# Patient Record
Sex: Female | Born: 1945 | Race: White | Hispanic: No | Marital: Married | Smoking: Never smoker
Health system: Southern US, Community
[De-identification: ages and names within clinical notes are randomized; demographics above are authoritative.]

## PROBLEM LIST (undated history)

## (undated) DIAGNOSIS — K219 Gastro-esophageal reflux disease without esophagitis: Secondary | ICD-10-CM

## (undated) DIAGNOSIS — F419 Anxiety disorder, unspecified: Secondary | ICD-10-CM

## (undated) DIAGNOSIS — Z5189 Encounter for other specified aftercare: Secondary | ICD-10-CM

## (undated) DIAGNOSIS — D126 Benign neoplasm of colon, unspecified: Secondary | ICD-10-CM

## (undated) HISTORY — PX: REPLACEMENT TOTAL KNEE: SUR1224

## (undated) HISTORY — PX: OTHER SURGICAL HISTORY: SHX169

## (undated) HISTORY — DX: Encounter for other specified aftercare: Z51.89

## (undated) HISTORY — DX: Benign neoplasm of colon, unspecified: D12.6

## (undated) HISTORY — PX: CATARACT EXTRACTION, BILATERAL: SHX1313

## (undated) HISTORY — DX: Anxiety disorder, unspecified: F41.9

## (undated) HISTORY — DX: Gastro-esophageal reflux disease without esophagitis: K21.9

---
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
## 1997-11-30 ENCOUNTER — Ambulatory Visit (HOSPITAL_COMMUNITY): Admission: RE | Admit: 1997-11-30 | Discharge: 1997-11-30 | Payer: Self-pay | Admitting: Pulmonary Disease

## 1997-11-30 ENCOUNTER — Encounter: Payer: Self-pay | Admitting: Pulmonary Disease

## 1998-01-06 ENCOUNTER — Other Ambulatory Visit: Admission: RE | Admit: 1998-01-06 | Discharge: 1998-01-06 | Payer: Self-pay | Admitting: Gynecology

## 1999-02-08 ENCOUNTER — Other Ambulatory Visit: Admission: RE | Admit: 1999-02-08 | Discharge: 1999-02-08 | Payer: Self-pay | Admitting: Gynecology

## 1999-03-24 HISTORY — PX: ANAL FISSURE REPAIR: SHX2312

## 1999-04-18 ENCOUNTER — Ambulatory Visit (HOSPITAL_COMMUNITY): Admission: RE | Admit: 1999-04-18 | Discharge: 1999-04-18 | Payer: Self-pay | Admitting: General Surgery

## 1999-12-01 ENCOUNTER — Encounter: Payer: Self-pay | Admitting: Pulmonary Disease

## 1999-12-01 ENCOUNTER — Encounter: Admission: RE | Admit: 1999-12-01 | Discharge: 1999-12-01 | Payer: Self-pay | Admitting: Pulmonary Disease

## 2000-03-15 ENCOUNTER — Other Ambulatory Visit: Admission: RE | Admit: 2000-03-15 | Discharge: 2000-03-15 | Payer: Self-pay | Admitting: Gynecology

## 2003-01-05 ENCOUNTER — Other Ambulatory Visit: Admission: RE | Admit: 2003-01-05 | Discharge: 2003-01-05 | Payer: Self-pay | Admitting: Gynecology

## 2004-01-12 ENCOUNTER — Ambulatory Visit: Payer: Self-pay | Admitting: Pulmonary Disease

## 2004-01-20 ENCOUNTER — Ambulatory Visit: Payer: Self-pay | Admitting: Pulmonary Disease

## 2004-02-18 ENCOUNTER — Ambulatory Visit: Payer: Self-pay | Admitting: Pulmonary Disease

## 2004-02-18 ENCOUNTER — Other Ambulatory Visit: Admission: RE | Admit: 2004-02-18 | Discharge: 2004-02-18 | Payer: Self-pay | Admitting: Gynecology

## 2004-04-26 ENCOUNTER — Ambulatory Visit: Payer: Self-pay | Admitting: Pulmonary Disease

## 2004-06-27 ENCOUNTER — Ambulatory Visit: Payer: Self-pay | Admitting: Pulmonary Disease

## 2004-08-04 ENCOUNTER — Ambulatory Visit: Payer: Self-pay | Admitting: Pulmonary Disease

## 2004-08-25 ENCOUNTER — Ambulatory Visit: Payer: Self-pay | Admitting: Pulmonary Disease

## 2005-03-02 ENCOUNTER — Other Ambulatory Visit: Admission: RE | Admit: 2005-03-02 | Discharge: 2005-03-02 | Payer: Self-pay | Admitting: Gynecology

## 2005-05-11 ENCOUNTER — Ambulatory Visit: Payer: Self-pay | Admitting: Pulmonary Disease

## 2005-05-19 ENCOUNTER — Ambulatory Visit: Payer: Self-pay | Admitting: Gastroenterology

## 2005-06-05 ENCOUNTER — Ambulatory Visit: Payer: Self-pay | Admitting: Gastroenterology

## 2005-07-05 ENCOUNTER — Ambulatory Visit: Payer: Self-pay | Admitting: Gastroenterology

## 2005-08-15 ENCOUNTER — Ambulatory Visit: Payer: Self-pay | Admitting: Gastroenterology

## 2005-08-23 ENCOUNTER — Ambulatory Visit: Payer: Self-pay | Admitting: Gastroenterology

## 2005-08-23 ENCOUNTER — Encounter (INDEPENDENT_AMBULATORY_CARE_PROVIDER_SITE_OTHER): Payer: Self-pay | Admitting: Specialist

## 2005-09-06 ENCOUNTER — Encounter: Admission: RE | Admit: 2005-09-06 | Discharge: 2005-09-06 | Payer: Self-pay | Admitting: Gastroenterology

## 2005-10-19 ENCOUNTER — Ambulatory Visit: Payer: Self-pay | Admitting: Pulmonary Disease

## 2006-03-04 ENCOUNTER — Emergency Department (HOSPITAL_COMMUNITY): Admission: EM | Admit: 2006-03-04 | Discharge: 2006-03-05 | Payer: Self-pay | Admitting: Emergency Medicine

## 2006-05-24 ENCOUNTER — Ambulatory Visit: Payer: Self-pay | Admitting: Pulmonary Disease

## 2006-05-24 LAB — CONVERTED CEMR LAB
ALT: 31 units/L (ref 0–40)
AST: 24 units/L (ref 0–37)
Albumin: 3.7 g/dL (ref 3.5–5.2)
Alkaline Phosphatase: 108 units/L (ref 39–117)
BUN: 11 mg/dL (ref 6–23)
Basophils Absolute: 0 10*3/uL (ref 0.0–0.1)
Basophils Relative: 0.5 % (ref 0.0–1.0)
Bilirubin Urine: NEGATIVE
Bilirubin, Direct: 0.2 mg/dL (ref 0.0–0.3)
CO2: 28 meq/L (ref 19–32)
Calcium: 9.1 mg/dL (ref 8.4–10.5)
Chloride: 107 meq/L (ref 96–112)
Cholesterol: 217 mg/dL (ref 0–200)
Creatinine, Ser: 0.8 mg/dL (ref 0.4–1.2)
Crystals: NEGATIVE
Direct LDL: 81 mg/dL
Eosinophils Absolute: 0.1 10*3/uL (ref 0.0–0.6)
Eosinophils Relative: 3 % (ref 0.0–5.0)
GFR calc Af Amer: 94 mL/min
GFR calc non Af Amer: 78 mL/min
Glucose, Bld: 132 mg/dL — ABNORMAL HIGH (ref 70–99)
HCT: 37.2 % (ref 36.0–46.0)
HDL: 41 mg/dL (ref >39.0)
Hemoglobin: 13 g/dL (ref 12.0–15.0)
Hgb A1c MFr Bld: 5.5 % (ref 4.6–6.0)
Ketones, ur: NEGATIVE mg/dL
Leukocytes, UA: NEGATIVE
Lymphocytes Relative: 25.3 % (ref 12.0–46.0)
MCHC: 34.8 g/dL (ref 30.0–36.0)
MCV: 89.2 fL (ref 78.0–100.0)
Monocytes Absolute: 0.3 10*3/uL (ref 0.2–0.7)
Monocytes Relative: 7.2 % (ref 3.0–11.0)
Neutro Abs: 3 10*3/uL (ref 1.4–7.7)
Neutrophils Relative %: 64 % (ref 43.0–77.0)
Nitrite: NEGATIVE
Platelets: 146 10*3/uL — ABNORMAL LOW (ref 150–400)
Potassium: 4.3 meq/L (ref 3.5–5.1)
RBC: 4.17 M/uL (ref 3.87–5.11)
RDW: 13.6 % (ref 11.5–14.6)
Sodium: 143 meq/L (ref 135–145)
Specific Gravity, Urine: 1.02 (ref 1.000–1.03)
TSH: 0.8 microintl units/mL (ref 0.35–5.50)
Total Bilirubin: 0.8 mg/dL (ref 0.3–1.2)
Total CHOL/HDL Ratio: 5.3
Total Protein, Urine: NEGATIVE mg/dL
Total Protein: 7.1 g/dL (ref 6.0–8.3)
Triglycerides: 171 mg/dL — ABNORMAL HIGH (ref 0–149)
Urine Glucose: NEGATIVE mg/dL
Urobilinogen, UA: 1 (ref 0.0–1.0)
VLDL: 34 mg/dL (ref 0–40)
Vit D, 1,25-Dihydroxy: <7 — ABNORMAL LOW (ref 20–57)
WBC: 4.6 10*3/uL (ref 4.5–10.5)
pH: 6 (ref 5.0–8.0)

## 2006-07-02 ENCOUNTER — Ambulatory Visit: Payer: Self-pay | Admitting: Pulmonary Disease

## 2006-09-13 ENCOUNTER — Ambulatory Visit: Payer: Self-pay | Admitting: Pulmonary Disease

## 2006-09-13 LAB — CONVERTED CEMR LAB
BUN: 13 mg/dL (ref 6–23)
CO2: 30 meq/L (ref 19–32)
Calcium: 8.9 mg/dL (ref 8.4–10.5)
Chloride: 103 meq/L (ref 96–112)
Cholesterol: 158 mg/dL (ref 0–200)
Creatinine, Ser: 0.8 mg/dL (ref 0.4–1.2)
Direct LDL: 64 mg/dL
GFR calc Af Amer: 94 mL/min
GFR calc non Af Amer: 78 mL/min
Glucose, Bld: 140 mg/dL — ABNORMAL HIGH (ref 70–99)
HDL: 42.4 mg/dL (ref >39.0)
Potassium: 4 meq/L (ref 3.5–5.1)
Sodium: 140 meq/L (ref 135–145)
Total CHOL/HDL Ratio: 3.7
Triglycerides: 517 mg/dL (ref 0–149)
VLDL: 103 mg/dL — ABNORMAL HIGH (ref 0–40)

## 2006-10-22 ENCOUNTER — Ambulatory Visit: Payer: Self-pay | Admitting: Pulmonary Disease

## 2006-10-22 LAB — CONVERTED CEMR LAB
CO2: 31 meq/L (ref 19–32)
Cholesterol: 183 mg/dL (ref 0–200)
GFR calc Af Amer: 94 mL/min
GFR calc non Af Amer: 78 mL/min
Glucose, Bld: 118 mg/dL — ABNORMAL HIGH (ref 70–99)
HDL: 44.9 mg/dL (ref >39.0)
LDL Cholesterol: 113 mg/dL — ABNORMAL HIGH (ref 0–99)
Sodium: 144 meq/L (ref 135–145)
Total CHOL/HDL Ratio: 4.1

## 2006-12-07 ENCOUNTER — Ambulatory Visit: Payer: Self-pay | Admitting: Gastroenterology

## 2006-12-07 LAB — CONVERTED CEMR LAB
Basophils Absolute: 0 10*3/uL (ref 0.0–0.1)
Eosinophils Absolute: 0.1 10*3/uL (ref 0.0–0.6)
Hemoglobin: 13.2 g/dL (ref 12.0–15.0)
MCHC: 35 g/dL (ref 30.0–36.0)
MCV: 91.9 fL (ref 78.0–100.0)
Monocytes Absolute: 0.5 10*3/uL (ref 0.2–0.7)
Monocytes Relative: 7.4 % (ref 3.0–11.0)
RBC: 4.11 M/uL (ref 3.87–5.11)
RDW: 13.6 % (ref 11.5–14.6)

## 2006-12-10 ENCOUNTER — Ambulatory Visit: Payer: Self-pay | Admitting: Gastroenterology

## 2007-01-28 ENCOUNTER — Telehealth (INDEPENDENT_AMBULATORY_CARE_PROVIDER_SITE_OTHER): Payer: Self-pay | Admitting: *Deleted

## 2007-01-29 ENCOUNTER — Telehealth: Payer: Self-pay | Admitting: Pulmonary Disease

## 2007-01-29 DIAGNOSIS — I10 Essential (primary) hypertension: Secondary | ICD-10-CM | POA: Insufficient documentation

## 2007-01-29 DIAGNOSIS — E669 Obesity, unspecified: Secondary | ICD-10-CM | POA: Insufficient documentation

## 2007-01-29 DIAGNOSIS — J309 Allergic rhinitis, unspecified: Secondary | ICD-10-CM | POA: Insufficient documentation

## 2007-01-29 DIAGNOSIS — K219 Gastro-esophageal reflux disease without esophagitis: Secondary | ICD-10-CM | POA: Insufficient documentation

## 2007-01-29 DIAGNOSIS — K589 Irritable bowel syndrome without diarrhea: Secondary | ICD-10-CM | POA: Insufficient documentation

## 2007-01-30 ENCOUNTER — Telehealth: Payer: Self-pay | Admitting: Adult Health

## 2007-03-08 DIAGNOSIS — K5730 Diverticulosis of large intestine without perforation or abscess without bleeding: Secondary | ICD-10-CM | POA: Insufficient documentation

## 2007-03-08 DIAGNOSIS — K644 Residual hemorrhoidal skin tags: Secondary | ICD-10-CM | POA: Insufficient documentation

## 2007-05-09 ENCOUNTER — Ambulatory Visit: Payer: Self-pay | Admitting: Pulmonary Disease

## 2007-05-09 ENCOUNTER — Encounter: Payer: Self-pay | Admitting: Adult Health

## 2007-05-09 DIAGNOSIS — R0602 Shortness of breath: Secondary | ICD-10-CM | POA: Insufficient documentation

## 2007-05-10 LAB — CONVERTED CEMR LAB
ALT: 49 units/L — ABNORMAL HIGH (ref 0–35)
Albumin: 4 g/dL (ref 3.5–5.2)
Alkaline Phosphatase: 110 units/L (ref 39–117)
BUN: 8 mg/dL (ref 6–23)
Basophils Absolute: 0 10*3/uL (ref 0.0–0.1)
Basophils Relative: 0.2 % (ref 0.0–1.0)
Bilirubin, Direct: 0.3 mg/dL (ref 0.0–0.3)
CO2: 32 meq/L (ref 19–32)
Calcium: 9.2 mg/dL (ref 8.4–10.5)
Cholesterol: 208 mg/dL (ref 0–200)
Eosinophils Absolute: 0.1 10*3/uL (ref 0.0–0.7)
Eosinophils Relative: 2.5 % (ref 0.0–5.0)
GFR calc Af Amer: 82 mL/min
Glucose, Bld: 146 mg/dL — ABNORMAL HIGH (ref 70–99)
HCT: 41.5 % (ref 36.0–46.0)
Hemoglobin: 13.8 g/dL (ref 12.0–15.0)
Ketones, ur: NEGATIVE mg/dL
MCHC: 33.3 g/dL (ref 30.0–36.0)
MCV: 93.1 fL (ref 78.0–100.0)
Monocytes Absolute: 0.4 10*3/uL (ref 0.1–1.0)
Mucus, UA: NEGATIVE
Neutro Abs: 3.9 10*3/uL (ref 1.4–7.7)
Pro B Natriuretic peptide (BNP): 24 pg/mL (ref 0.0–100.0)
RBC: 4.46 M/uL (ref 3.87–5.11)
Saturation Ratios: 26.6 % (ref 20.0–50.0)
Sodium: 143 meq/L (ref 135–145)
Specific Gravity, Urine: 1.01 (ref 1.000–1.03)
TSH: 0.74 microintl units/mL (ref 0.35–5.50)
Total Protein, Urine: NEGATIVE mg/dL
Total Protein: 7.5 g/dL (ref 6.0–8.3)
Transferrin: 255.2 mg/dL (ref >=212.0)
Urine Glucose: NEGATIVE mg/dL
Urobilinogen, UA: 0.2 (ref 0.0–1.0)
VLDL: 35 mg/dL (ref 0–40)
WBC: 5.7 10*3/uL (ref 4.5–10.5)
pH: 6.5 (ref 5.0–8.0)

## 2007-05-17 ENCOUNTER — Ambulatory Visit: Payer: Self-pay | Admitting: Internal Medicine

## 2007-05-17 DIAGNOSIS — E119 Type 2 diabetes mellitus without complications: Secondary | ICD-10-CM | POA: Insufficient documentation

## 2007-05-17 DIAGNOSIS — E538 Deficiency of other specified B group vitamins: Secondary | ICD-10-CM | POA: Insufficient documentation

## 2007-06-14 ENCOUNTER — Telehealth (INDEPENDENT_AMBULATORY_CARE_PROVIDER_SITE_OTHER): Payer: Self-pay | Admitting: *Deleted

## 2007-06-20 ENCOUNTER — Ambulatory Visit: Payer: Self-pay | Admitting: Internal Medicine

## 2007-07-01 ENCOUNTER — Telehealth: Payer: Self-pay | Admitting: Pulmonary Disease

## 2007-07-18 ENCOUNTER — Ambulatory Visit: Payer: Self-pay | Admitting: Emergency Medicine

## 2007-08-12 ENCOUNTER — Telehealth (INDEPENDENT_AMBULATORY_CARE_PROVIDER_SITE_OTHER): Payer: Self-pay | Admitting: *Deleted

## 2007-08-13 ENCOUNTER — Telehealth (INDEPENDENT_AMBULATORY_CARE_PROVIDER_SITE_OTHER): Payer: Self-pay | Admitting: *Deleted

## 2007-08-15 ENCOUNTER — Ambulatory Visit: Payer: Self-pay | Admitting: Pulmonary Disease

## 2007-08-30 ENCOUNTER — Emergency Department (HOSPITAL_COMMUNITY): Admission: EM | Admit: 2007-08-30 | Discharge: 2007-08-30 | Payer: Self-pay | Admitting: Emergency Medicine

## 2007-10-10 ENCOUNTER — Ambulatory Visit: Payer: Self-pay | Admitting: Pulmonary Disease

## 2007-10-10 DIAGNOSIS — F411 Generalized anxiety disorder: Secondary | ICD-10-CM | POA: Insufficient documentation

## 2007-10-10 DIAGNOSIS — I872 Venous insufficiency (chronic) (peripheral): Secondary | ICD-10-CM | POA: Insufficient documentation

## 2007-10-10 DIAGNOSIS — M545 Low back pain, unspecified: Secondary | ICD-10-CM | POA: Insufficient documentation

## 2007-10-10 DIAGNOSIS — M1990 Unspecified osteoarthritis, unspecified site: Secondary | ICD-10-CM | POA: Insufficient documentation

## 2007-10-10 DIAGNOSIS — E559 Vitamin D deficiency, unspecified: Secondary | ICD-10-CM | POA: Insufficient documentation

## 2007-10-10 DIAGNOSIS — E785 Hyperlipidemia, unspecified: Secondary | ICD-10-CM | POA: Insufficient documentation

## 2007-10-23 ENCOUNTER — Ambulatory Visit: Payer: Self-pay | Admitting: Pulmonary Disease

## 2007-10-23 ENCOUNTER — Telehealth: Payer: Self-pay | Admitting: Pulmonary Disease

## 2007-10-23 ENCOUNTER — Encounter: Payer: Self-pay | Admitting: Pulmonary Disease

## 2007-10-23 ENCOUNTER — Ambulatory Visit: Payer: Self-pay

## 2007-10-31 ENCOUNTER — Telehealth: Payer: Self-pay | Admitting: Pulmonary Disease

## 2007-11-07 LAB — CONVERTED CEMR LAB
CO2: 28 meq/L (ref 19–32)
Calcium: 8.9 mg/dL (ref 8.4–10.5)
Creatinine, Ser: 0.9 mg/dL (ref 0.4–1.2)
GFR calc Af Amer: 82 mL/min
Glucose, Bld: 129 mg/dL — ABNORMAL HIGH (ref 70–99)
HDL: 39.1 mg/dL (ref >39.0)
Triglycerides: 175 mg/dL — ABNORMAL HIGH (ref 0–149)
VLDL: 35 mg/dL (ref 0–40)

## 2007-11-21 ENCOUNTER — Ambulatory Visit: Payer: Self-pay | Admitting: Pulmonary Disease

## 2007-11-21 ENCOUNTER — Ambulatory Visit: Payer: Self-pay | Admitting: Internal Medicine

## 2007-12-06 ENCOUNTER — Telehealth (INDEPENDENT_AMBULATORY_CARE_PROVIDER_SITE_OTHER): Payer: Self-pay | Admitting: *Deleted

## 2007-12-16 ENCOUNTER — Telehealth (INDEPENDENT_AMBULATORY_CARE_PROVIDER_SITE_OTHER): Payer: Self-pay | Admitting: *Deleted

## 2008-04-09 ENCOUNTER — Ambulatory Visit: Payer: Self-pay | Admitting: Pulmonary Disease

## 2008-04-23 ENCOUNTER — Encounter: Payer: Self-pay | Admitting: Pulmonary Disease

## 2008-04-23 DIAGNOSIS — Z5189 Encounter for other specified aftercare: Secondary | ICD-10-CM

## 2008-04-23 HISTORY — DX: Encounter for other specified aftercare: Z51.89

## 2008-04-23 HISTORY — PX: CHOLECYSTECTOMY: SHX55

## 2008-04-26 ENCOUNTER — Encounter: Payer: Self-pay | Admitting: Pulmonary Disease

## 2008-04-29 ENCOUNTER — Telehealth (INDEPENDENT_AMBULATORY_CARE_PROVIDER_SITE_OTHER): Payer: Self-pay | Admitting: *Deleted

## 2008-04-30 ENCOUNTER — Telehealth (INDEPENDENT_AMBULATORY_CARE_PROVIDER_SITE_OTHER): Payer: Self-pay | Admitting: *Deleted

## 2008-05-01 ENCOUNTER — Ambulatory Visit: Payer: Self-pay | Admitting: Pulmonary Disease

## 2008-05-01 ENCOUNTER — Ambulatory Visit: Payer: Self-pay | Admitting: Internal Medicine

## 2008-05-01 DIAGNOSIS — D649 Anemia, unspecified: Secondary | ICD-10-CM | POA: Insufficient documentation

## 2008-05-03 LAB — CONVERTED CEMR LAB
Albumin: 3.1 g/dL — ABNORMAL LOW (ref 3.5–5.2)
Alkaline Phosphatase: 48 units/L (ref 39–117)
Basophils Absolute: 0 10*3/uL (ref 0.0–0.1)
Bilirubin, Direct: 0.2 mg/dL (ref 0.0–0.3)
Calcium: 8.4 mg/dL (ref 8.4–10.5)
GFR calc non Af Amer: 89.91 mL/min (ref >60)
Glucose, Bld: 107 mg/dL — ABNORMAL HIGH (ref 70–99)
HCT: 32.1 % — ABNORMAL LOW (ref 36.0–46.0)
HDL: 21.5 mg/dL — ABNORMAL LOW (ref >39.00)
Hemoglobin: 11.3 g/dL — ABNORMAL LOW (ref 12.0–15.0)
Iron: 38 ug/dL — ABNORMAL LOW (ref 42–145)
LDL Cholesterol: 57 mg/dL (ref 0–99)
Lymphs Abs: 0.7 10*3/uL (ref 0.7–4.0)
MCV: 94.7 fL (ref 78.0–100.0)
Monocytes Absolute: 0.4 10*3/uL (ref 0.1–1.0)
Monocytes Relative: 9.7 % (ref 3.0–12.0)
Neutro Abs: 3.2 10*3/uL (ref 1.4–7.7)
Potassium: 3.4 meq/L — ABNORMAL LOW (ref 3.5–5.1)
RDW: 13.9 % (ref 11.5–14.6)
Sodium: 143 meq/L (ref 135–145)
Total Bilirubin: 1 mg/dL (ref 0.3–1.2)
Total CHOL/HDL Ratio: 5
Transferrin: 209 mg/dL — ABNORMAL LOW (ref 212.0–360.0)
VLDL: 24 mg/dL (ref 0.0–40.0)

## 2008-05-13 ENCOUNTER — Encounter: Payer: Self-pay | Admitting: Pulmonary Disease

## 2008-05-13 ENCOUNTER — Telehealth: Payer: Self-pay | Admitting: Pulmonary Disease

## 2008-06-30 ENCOUNTER — Ambulatory Visit: Payer: Self-pay | Admitting: Pulmonary Disease

## 2008-07-01 LAB — CONVERTED CEMR LAB
Basophils Relative: 4 % — ABNORMAL HIGH (ref 0.0–3.0)
CO2: 32 meq/L (ref 19–32)
Calcium: 9.2 mg/dL (ref 8.4–10.5)
Creatinine, Ser: 0.9 mg/dL (ref 0.4–1.2)
Eosinophils Relative: 8.3 % — ABNORMAL HIGH (ref 0.0–5.0)
Glucose, Bld: 114 mg/dL — ABNORMAL HIGH (ref 70–99)
Lymphocytes Relative: 20.2 % (ref 12.0–46.0)
MCV: 89.4 fL (ref 78.0–100.0)
Neutrophils Relative %: 62.6 % (ref 43.0–77.0)
RBC: 3.66 M/uL — ABNORMAL LOW (ref 3.87–5.11)
WBC: 6.6 10*3/uL (ref 4.5–10.5)

## 2008-08-13 ENCOUNTER — Ambulatory Visit: Payer: Self-pay | Admitting: Pulmonary Disease

## 2008-09-29 ENCOUNTER — Telehealth: Payer: Self-pay | Admitting: Gastroenterology

## 2008-10-19 ENCOUNTER — Emergency Department (HOSPITAL_COMMUNITY): Admission: EM | Admit: 2008-10-19 | Discharge: 2008-10-19 | Payer: Self-pay | Admitting: Family Medicine

## 2008-11-12 ENCOUNTER — Ambulatory Visit: Payer: Self-pay | Admitting: Gastroenterology

## 2008-11-12 DIAGNOSIS — K625 Hemorrhage of anus and rectum: Secondary | ICD-10-CM | POA: Insufficient documentation

## 2008-11-12 DIAGNOSIS — K6289 Other specified diseases of anus and rectum: Secondary | ICD-10-CM | POA: Insufficient documentation

## 2008-11-12 DIAGNOSIS — R197 Diarrhea, unspecified: Secondary | ICD-10-CM | POA: Insufficient documentation

## 2008-12-26 ENCOUNTER — Encounter: Payer: Self-pay | Admitting: Gastroenterology

## 2008-12-29 ENCOUNTER — Telehealth: Payer: Self-pay | Admitting: Gastroenterology

## 2009-07-09 IMAGING — CR DG CHEST 2V
2 series · 2 of 2 positions shown · non-contrast
Comparison: 05/24/2006

CLINICAL DATA: Short of breath

CHEST - 2 VIEW

[view not recorded (1 of 2)]
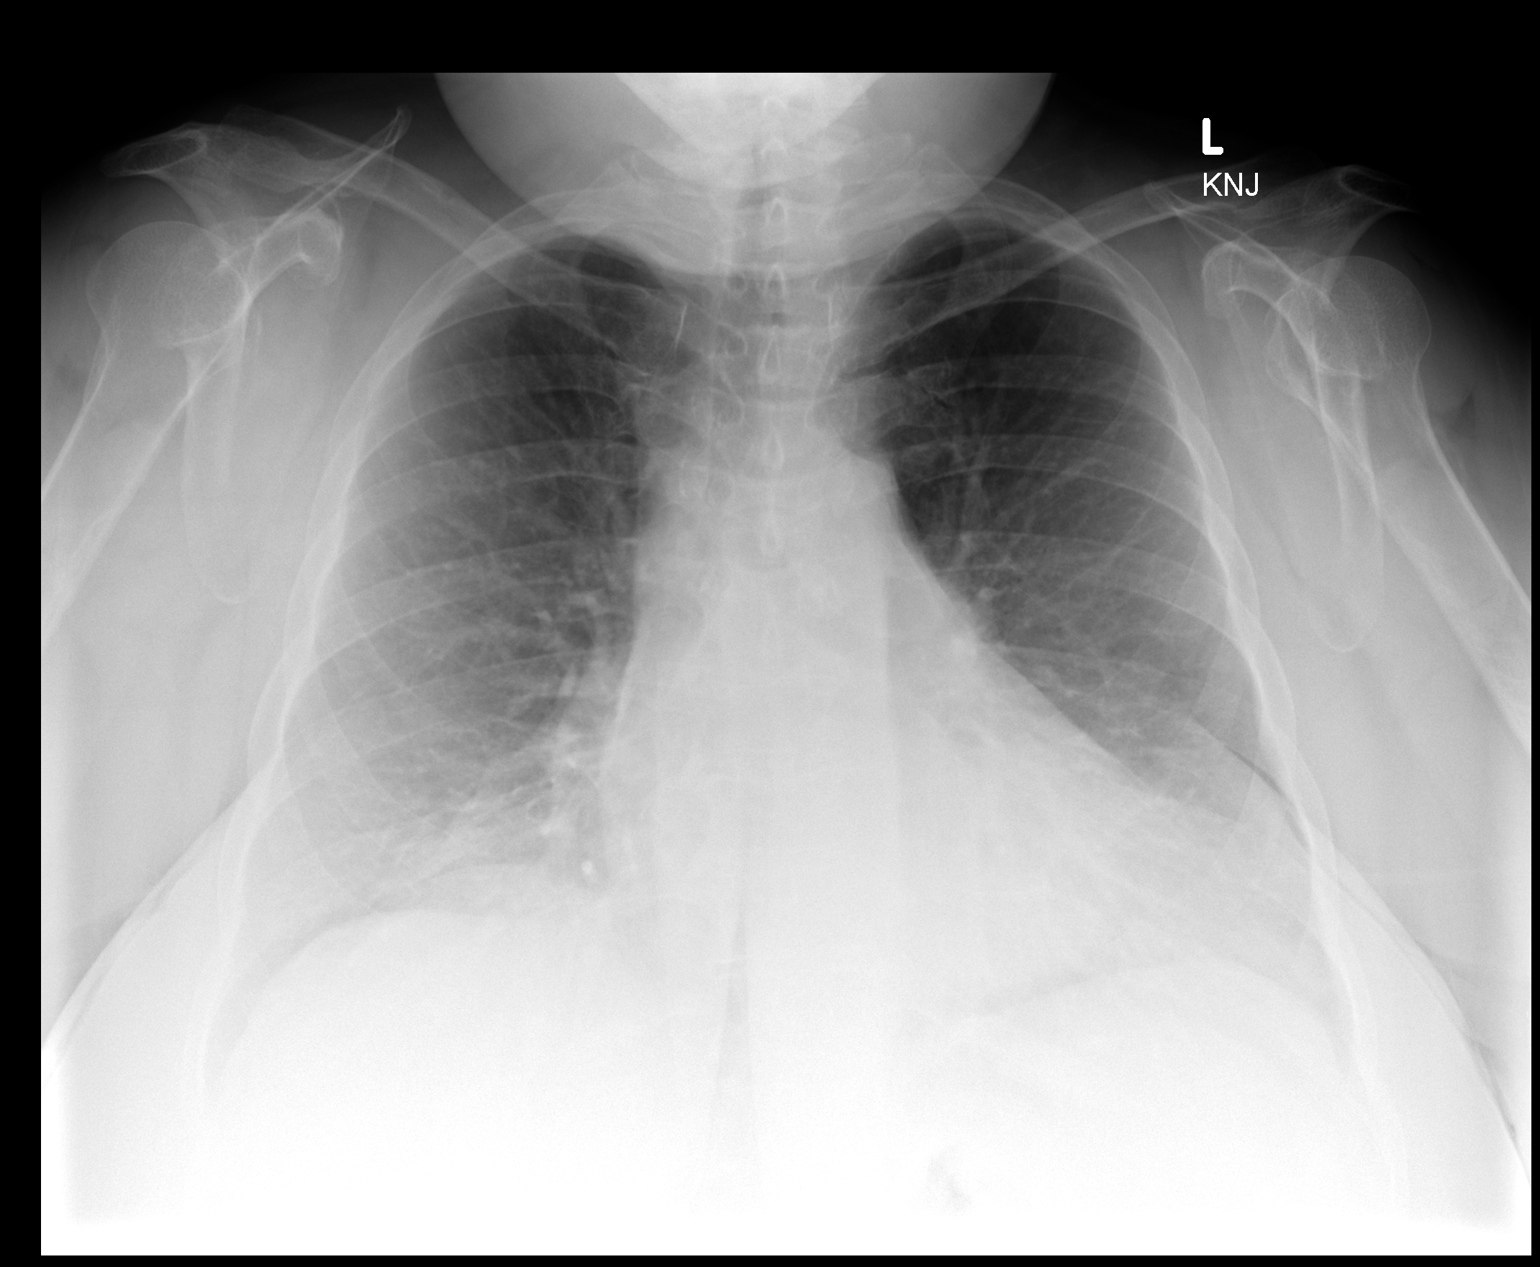

[view not recorded (2 of 2)]
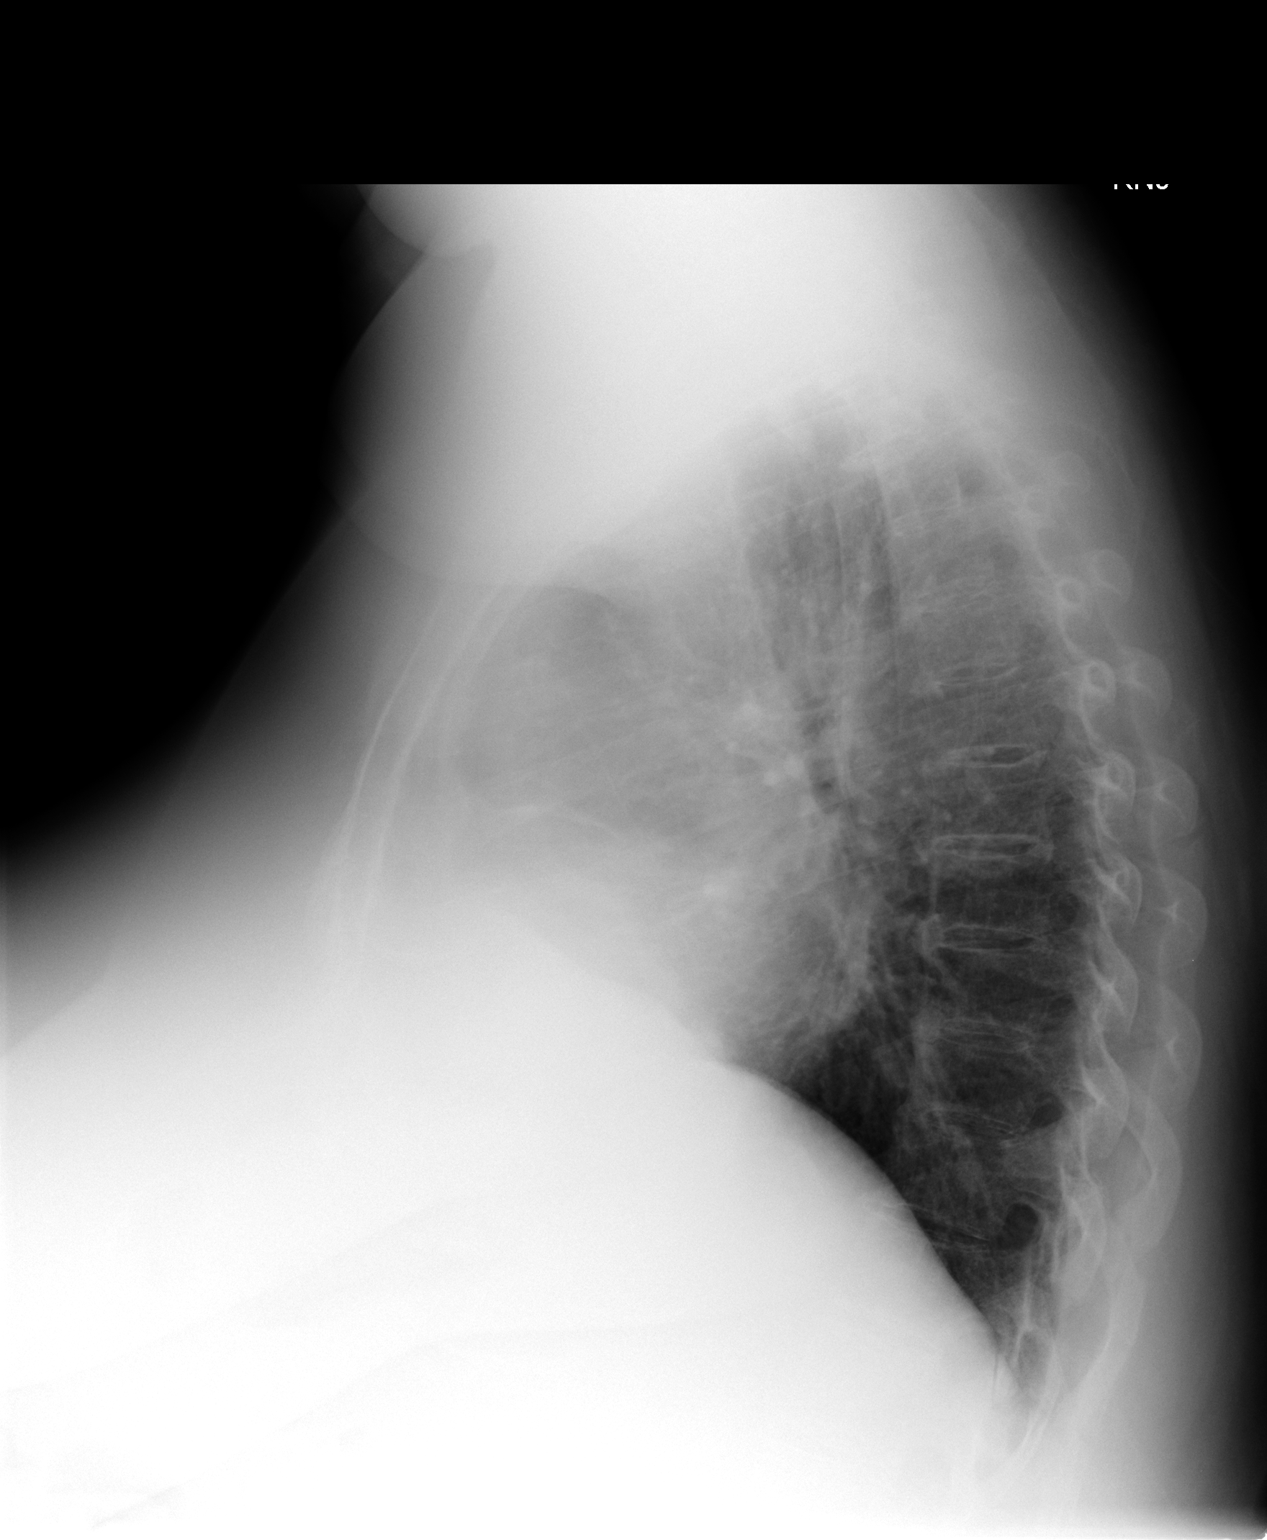

[2 of 2 positions shown; findings below may reference images not displayed]

FINDINGS: Heart moderately enlarged.  No definite congestive heart
failure or active disease.  No pleural fluid or osseous lesions.
IMPRESSION: None
1.  Cardiomegaly.
2.  No definite congestive heart failure or active disease.

## 2010-02-12 ENCOUNTER — Encounter: Payer: Self-pay | Admitting: Gynecology

## 2010-02-13 ENCOUNTER — Encounter: Payer: Self-pay | Admitting: Orthopedic Surgery

## 2010-02-22 NOTE — Procedures (Signed)
Summary: Gastroenterology EGD  Gastroenterology EGD   Imported By: Christie Nottingham 03/11/2007 15:50:38  _____________________________________________________________________  External Attachment:    Type:   Image     Comment:   External Document

## 2010-04-29 LAB — POCT I-STAT, CHEM 8
BUN: 18 mg/dL (ref 6–23)
Chloride: 105 mEq/L (ref 96–112)
HCT: 38 % (ref 36.0–46.0)
Potassium: 4 mEq/L (ref 3.5–5.1)

## 2010-06-07 NOTE — Assessment & Plan Note (Signed)
[facility]                         GASTROENTEROLOGY OFFICE NOTE   NAME:Stacie Rodriguez, Stacie Rodriguez                      MRN:          [id number]  DATE:12/07/2006                            DOB:          Feb 23, 1945    URGENT ADD ON NOTE   PROBLEM:  Rectal bleeding.   HISTORY:  Avelynn is a pleasant, 65 year old, white female, known to Dr.  Jarold Motto, and a primary patient of Dr. Kriste Basque.  She has a significant  history of anxiety and depression and asthmatic bronchitis and morbid  obesity.  She was last seen by Dr. Jarold Motto in 2007, at which time she  had undergone upper endoscopy for complaints of dysphagia.  She was  found to have an unspecified gastritis and a normal-appearing esophagus.  She did have a Maloney dilation to 56 for symptomatic dysphagia.  She  had colonoscopy in May of 2007, showing left colon diverticular disease,  otherwise negative exam.   The patient relates that she did have a fissure, which required surgical  repair in 2001, per Dr. Earlene Plater.  Currently, the patient comes in for  acute onset of rectal bleeding, which started this morning.  She said  she had been doing fine but has been very stressed recently due in part  to the death of her sister.  She said she had an urge for a bowel  movement, felt a little bit constipated, did not have any abdominal pain  or rectal pain, but did have a bowel movement and then noticed a lot of  bright red blood on the tissue.  She said the stool itself did not  appear grossly bloody.  She has not had any further bowel movements or  episodes of bleeding today.   CURRENT MEDICATIONS:  1. Vitamin E daily.  2. Aspirin 325 daily.  3. Stool softener daily.  4. Furosemide 20 mg daily.  5. Zoloft 100 mg daily.  6. Clorazepate 7.5 daily.  7. Omeprazole 20 mg daily.   ALLERGIES:  1. SULFA.  2. PENICILLIN.  3. MOTRIN.  4. CODEINE.   PHYSICAL EXAMINATION:  GENERAL:  Well-developed, obese, white  female.  VITAL SIGNS:  Weight is 220.4, blood pressure 138/82, pulse is 92.  CARDIOVASCULAR:  Regular rate and rhythm with S1 and S2.  PULMONARY:  Clear to A&P.  ABDOMEN:  Obese, soft and nontender, there is no palpable mass or  hepatosplenomegaly, bowel sounds are active.  RECTAL EXAM:  There is no external lesion noted, she is nontender to  exam, stool is brown and hemoccult positive.   IMPRESSION:  A 65 year old, white female with single episode of  hematochezia.  Etiology is not entirely clear, but suspect a local  anorectal irritation or internal hemorrhoid.  Another possibility would  be a self-limited diverticular bleed though this is felt less likely.   PLAN:  1. The patient is quite anxious about this episode and fearful and      would like definitive evaluation.  Therefore, we will schedule her      for a flexible sigmoidoscopy with Dr. Jarold Motto within the next      week.  Check CBC with diff today.  2. Start Anusol-HC suppositories q.h.s. x10 days.      Mike Gip, PA-C  Electronically Signed      Rachael Fee, MD  Electronically Signed   AE/MedQ  DD: 12/07/2006  DT: 12/08/2006  Job #: [id number]   cc:   Vania Rea. Jarold Motto, MD, Clementeen Graham, FACP, FAGA  Lonzo Cloud. Kriste Basque, MD

## 2010-06-10 NOTE — Procedures (Signed)
[facility]                                 ULTRASOUND STUDY   NAME:NICHOLSDondi, Burandt                      MRN:          [id number]  DATE:08/23/2005                            DOB:          03/18/1945    ACCESSION NUMBER:  [id number]   READING PHYSICIAN:  Vania Rea. Jarold Motto, MD, Clementeen Graham, FACP   PROCEDURE:  Multiplanar abdominal ultrasound imaging was performed in the  upright, supine, right and left lateral decubitus positions.   RESULTS:  Abdominal aorta normal, 2.2 cm.  The IVC is patent.   The pancreas appears normal throughout the head, body and tail without  evidence of ductal dilatation, pancreatic masses, or peripancreatic  inflammation.   Gallbladder walls are slightly thickened, measuring 7 mm.  There is an 18 x  25 mm mobile shadowing stone present.   The common bile duct normal, measures 4.9 mm in maximal diameter without  evidence of intraluminal foci.   The liver has marked increased liver echo density without focal hepatic  mass, lesions or dilated biliary structures.   Kidneys are normal in appearance; right 10.1, left 10.6 cm.   Spleen is normal in size, measuring 10.1 cm without parenchymal lesion.   ASSESSMENT:  This ultrasound exam shows a very echo dense liver consistent  with fatty infiltration.  There is a prominent gallstone noted with slightly  thickened gallbladder walls.  There is no evidence of biliary ductal  dilatation.  The pancreas is well visualized and appears normal.                                   Vania Rea. Jarold Motto, MD, Clementeen Graham, [place]   DRP/MedQ  DD:  08/23/2005  DT:  08/23/2005  Job #:  [id number]

## 2010-10-21 LAB — POCT I-STAT, CHEM 8
Chloride: 102
HCT: 44
Hemoglobin: 15
Potassium: 3.8
Sodium: 138

## 2011-03-17 ENCOUNTER — Encounter: Payer: Self-pay | Admitting: Gastroenterology

## 2011-10-13 ENCOUNTER — Encounter: Payer: Self-pay | Admitting: Gastroenterology

## 2011-10-23 ENCOUNTER — Encounter: Payer: Self-pay | Admitting: Gastroenterology

## 2011-11-03 ENCOUNTER — Ambulatory Visit (AMBULATORY_SURGERY_CENTER): Payer: Medicare Other | Admitting: *Deleted

## 2011-11-03 VITALS — Ht 60.0 in | Wt 216.0 lb

## 2011-11-03 DIAGNOSIS — Z1211 Encounter for screening for malignant neoplasm of colon: Secondary | ICD-10-CM

## 2011-11-03 MED ORDER — MOVIPREP 100 G PO SOLR: ORAL | Status: DC

## 2011-11-17 ENCOUNTER — Encounter: Payer: Self-pay | Admitting: Gastroenterology

## 2014-02-09 DIAGNOSIS — I4891 Unspecified atrial fibrillation: Secondary | ICD-10-CM | POA: Diagnosis not present

## 2014-02-10 DIAGNOSIS — D519 Vitamin B12 deficiency anemia, unspecified: Secondary | ICD-10-CM | POA: Diagnosis not present

## 2014-03-05 DIAGNOSIS — J069 Acute upper respiratory infection, unspecified: Secondary | ICD-10-CM | POA: Diagnosis not present

## 2014-03-09 DIAGNOSIS — I2699 Other pulmonary embolism without acute cor pulmonale: Secondary | ICD-10-CM | POA: Diagnosis not present

## 2014-03-09 DIAGNOSIS — J209 Acute bronchitis, unspecified: Secondary | ICD-10-CM | POA: Diagnosis not present

## 2014-03-09 DIAGNOSIS — I4891 Unspecified atrial fibrillation: Secondary | ICD-10-CM | POA: Diagnosis not present

## 2014-03-12 DIAGNOSIS — I4891 Unspecified atrial fibrillation: Secondary | ICD-10-CM | POA: Diagnosis not present

## 2014-03-13 DIAGNOSIS — D519 Vitamin B12 deficiency anemia, unspecified: Secondary | ICD-10-CM | POA: Diagnosis not present

## 2014-03-16 DIAGNOSIS — I4891 Unspecified atrial fibrillation: Secondary | ICD-10-CM | POA: Diagnosis not present

## 2014-03-20 DIAGNOSIS — I4891 Unspecified atrial fibrillation: Secondary | ICD-10-CM | POA: Diagnosis not present

## 2014-03-25 DIAGNOSIS — H25043 Posterior subcapsular polar age-related cataract, bilateral: Secondary | ICD-10-CM | POA: Diagnosis not present

## 2014-03-25 DIAGNOSIS — I4891 Unspecified atrial fibrillation: Secondary | ICD-10-CM | POA: Diagnosis not present

## 2014-04-01 DIAGNOSIS — H2511 Age-related nuclear cataract, right eye: Secondary | ICD-10-CM | POA: Diagnosis not present

## 2014-04-01 DIAGNOSIS — H25811 Combined forms of age-related cataract, right eye: Secondary | ICD-10-CM | POA: Diagnosis not present

## 2014-04-07 DIAGNOSIS — E119 Type 2 diabetes mellitus without complications: Secondary | ICD-10-CM | POA: Diagnosis not present

## 2014-04-07 DIAGNOSIS — D6861 Antiphospholipid syndrome: Secondary | ICD-10-CM | POA: Diagnosis not present

## 2014-04-07 DIAGNOSIS — I10 Essential (primary) hypertension: Secondary | ICD-10-CM | POA: Diagnosis not present

## 2014-04-07 DIAGNOSIS — I4891 Unspecified atrial fibrillation: Secondary | ICD-10-CM | POA: Diagnosis not present

## 2014-04-08 DIAGNOSIS — H25812 Combined forms of age-related cataract, left eye: Secondary | ICD-10-CM | POA: Diagnosis not present

## 2014-04-08 DIAGNOSIS — H2512 Age-related nuclear cataract, left eye: Secondary | ICD-10-CM | POA: Diagnosis not present

## 2014-04-13 DIAGNOSIS — D519 Vitamin B12 deficiency anemia, unspecified: Secondary | ICD-10-CM | POA: Diagnosis not present

## 2014-04-27 DIAGNOSIS — Z96659 Presence of unspecified artificial knee joint: Secondary | ICD-10-CM | POA: Diagnosis not present

## 2014-04-27 DIAGNOSIS — Z961 Presence of intraocular lens: Secondary | ICD-10-CM | POA: Diagnosis not present

## 2014-05-08 DIAGNOSIS — Z7901 Long term (current) use of anticoagulants: Secondary | ICD-10-CM | POA: Diagnosis not present

## 2014-05-08 DIAGNOSIS — I4891 Unspecified atrial fibrillation: Secondary | ICD-10-CM | POA: Diagnosis not present

## 2014-05-14 DIAGNOSIS — D519 Vitamin B12 deficiency anemia, unspecified: Secondary | ICD-10-CM | POA: Diagnosis not present

## 2014-06-08 DIAGNOSIS — Z7901 Long term (current) use of anticoagulants: Secondary | ICD-10-CM | POA: Diagnosis not present

## 2014-06-08 DIAGNOSIS — I4891 Unspecified atrial fibrillation: Secondary | ICD-10-CM | POA: Diagnosis not present

## 2014-06-15 DIAGNOSIS — D519 Vitamin B12 deficiency anemia, unspecified: Secondary | ICD-10-CM | POA: Diagnosis not present

## 2014-07-15 DIAGNOSIS — D519 Vitamin B12 deficiency anemia, unspecified: Secondary | ICD-10-CM | POA: Diagnosis not present

## 2014-07-15 DIAGNOSIS — Z0000 Encounter for general adult medical examination without abnormal findings: Secondary | ICD-10-CM | POA: Diagnosis not present

## 2014-07-15 DIAGNOSIS — D6861 Antiphospholipid syndrome: Secondary | ICD-10-CM | POA: Diagnosis not present

## 2014-07-15 DIAGNOSIS — F330 Major depressive disorder, recurrent, mild: Secondary | ICD-10-CM | POA: Diagnosis not present

## 2014-07-15 DIAGNOSIS — E119 Type 2 diabetes mellitus without complications: Secondary | ICD-10-CM | POA: Diagnosis not present

## 2014-07-15 DIAGNOSIS — E785 Hyperlipidemia, unspecified: Secondary | ICD-10-CM | POA: Diagnosis not present

## 2014-07-15 DIAGNOSIS — I10 Essential (primary) hypertension: Secondary | ICD-10-CM | POA: Diagnosis not present

## 2014-07-15 DIAGNOSIS — Z1389 Encounter for screening for other disorder: Secondary | ICD-10-CM | POA: Diagnosis not present

## 2014-07-23 DIAGNOSIS — I4891 Unspecified atrial fibrillation: Secondary | ICD-10-CM | POA: Diagnosis not present

## 2014-08-03 DIAGNOSIS — I2699 Other pulmonary embolism without acute cor pulmonale: Secondary | ICD-10-CM | POA: Diagnosis not present

## 2014-08-03 DIAGNOSIS — I4891 Unspecified atrial fibrillation: Secondary | ICD-10-CM | POA: Diagnosis not present

## 2014-08-07 DIAGNOSIS — J019 Acute sinusitis, unspecified: Secondary | ICD-10-CM | POA: Diagnosis not present

## 2014-08-07 DIAGNOSIS — D6861 Antiphospholipid syndrome: Secondary | ICD-10-CM | POA: Diagnosis not present

## 2014-08-14 DIAGNOSIS — D519 Vitamin B12 deficiency anemia, unspecified: Secondary | ICD-10-CM | POA: Diagnosis not present

## 2014-08-21 DIAGNOSIS — D6861 Antiphospholipid syndrome: Secondary | ICD-10-CM | POA: Diagnosis not present

## 2014-09-07 DIAGNOSIS — D6861 Antiphospholipid syndrome: Secondary | ICD-10-CM | POA: Diagnosis not present

## 2014-09-14 DIAGNOSIS — D6861 Antiphospholipid syndrome: Secondary | ICD-10-CM | POA: Diagnosis not present

## 2014-09-15 DIAGNOSIS — D519 Vitamin B12 deficiency anemia, unspecified: Secondary | ICD-10-CM | POA: Diagnosis not present

## 2014-09-21 DIAGNOSIS — D6861 Antiphospholipid syndrome: Secondary | ICD-10-CM | POA: Diagnosis not present

## 2014-10-13 DIAGNOSIS — H43812 Vitreous degeneration, left eye: Secondary | ICD-10-CM | POA: Diagnosis not present

## 2014-10-19 DIAGNOSIS — M19042 Primary osteoarthritis, left hand: Secondary | ICD-10-CM | POA: Diagnosis not present

## 2014-10-19 DIAGNOSIS — D6861 Antiphospholipid syndrome: Secondary | ICD-10-CM | POA: Diagnosis not present

## 2014-10-19 DIAGNOSIS — J209 Acute bronchitis, unspecified: Secondary | ICD-10-CM | POA: Diagnosis not present

## 2014-10-19 DIAGNOSIS — D519 Vitamin B12 deficiency anemia, unspecified: Secondary | ICD-10-CM | POA: Diagnosis not present

## 2014-10-19 DIAGNOSIS — M79645 Pain in left finger(s): Secondary | ICD-10-CM | POA: Diagnosis not present

## 2014-10-19 DIAGNOSIS — F332 Major depressive disorder, recurrent severe without psychotic features: Secondary | ICD-10-CM | POA: Diagnosis not present

## 2014-11-17 DIAGNOSIS — D6861 Antiphospholipid syndrome: Secondary | ICD-10-CM | POA: Diagnosis not present

## 2014-11-17 DIAGNOSIS — Z23 Encounter for immunization: Secondary | ICD-10-CM | POA: Diagnosis not present

## 2014-11-30 DIAGNOSIS — D519 Vitamin B12 deficiency anemia, unspecified: Secondary | ICD-10-CM | POA: Diagnosis not present

## 2014-12-10 DIAGNOSIS — D1801 Hemangioma of skin and subcutaneous tissue: Secondary | ICD-10-CM | POA: Diagnosis not present

## 2014-12-10 DIAGNOSIS — B079 Viral wart, unspecified: Secondary | ICD-10-CM | POA: Diagnosis not present

## 2014-12-10 DIAGNOSIS — D485 Neoplasm of uncertain behavior of skin: Secondary | ICD-10-CM | POA: Diagnosis not present

## 2014-12-14 DIAGNOSIS — H4312 Vitreous hemorrhage, left eye: Secondary | ICD-10-CM | POA: Diagnosis not present

## 2014-12-14 DIAGNOSIS — I10 Essential (primary) hypertension: Secondary | ICD-10-CM | POA: Diagnosis not present

## 2014-12-21 DIAGNOSIS — F331 Major depressive disorder, recurrent, moderate: Secondary | ICD-10-CM | POA: Diagnosis not present

## 2014-12-21 DIAGNOSIS — D6861 Antiphospholipid syndrome: Secondary | ICD-10-CM | POA: Diagnosis not present

## 2014-12-21 DIAGNOSIS — I10 Essential (primary) hypertension: Secondary | ICD-10-CM | POA: Diagnosis not present

## 2014-12-31 DIAGNOSIS — D519 Vitamin B12 deficiency anemia, unspecified: Secondary | ICD-10-CM | POA: Diagnosis not present

## 2015-01-11 DIAGNOSIS — H43813 Vitreous degeneration, bilateral: Secondary | ICD-10-CM | POA: Diagnosis not present

## 2015-01-19 DIAGNOSIS — I2699 Other pulmonary embolism without acute cor pulmonale: Secondary | ICD-10-CM | POA: Diagnosis not present

## 2015-01-19 DIAGNOSIS — J029 Acute pharyngitis, unspecified: Secondary | ICD-10-CM | POA: Diagnosis not present

## 2015-01-21 DIAGNOSIS — J209 Acute bronchitis, unspecified: Secondary | ICD-10-CM | POA: Diagnosis not present

## 2015-01-26 DIAGNOSIS — I2699 Other pulmonary embolism without acute cor pulmonale: Secondary | ICD-10-CM | POA: Diagnosis not present

## 2015-02-02 DIAGNOSIS — D519 Vitamin B12 deficiency anemia, unspecified: Secondary | ICD-10-CM | POA: Diagnosis not present

## 2015-02-04 DIAGNOSIS — H4312 Vitreous hemorrhage, left eye: Secondary | ICD-10-CM | POA: Diagnosis not present

## 2015-02-10 DIAGNOSIS — H3412 Central retinal artery occlusion, left eye: Secondary | ICD-10-CM | POA: Diagnosis not present

## 2015-02-23 DIAGNOSIS — F411 Generalized anxiety disorder: Secondary | ICD-10-CM | POA: Diagnosis not present

## 2015-02-23 DIAGNOSIS — D6861 Antiphospholipid syndrome: Secondary | ICD-10-CM | POA: Diagnosis not present

## 2015-03-02 DIAGNOSIS — D6861 Antiphospholipid syndrome: Secondary | ICD-10-CM | POA: Diagnosis not present

## 2015-03-08 DIAGNOSIS — D519 Vitamin B12 deficiency anemia, unspecified: Secondary | ICD-10-CM | POA: Diagnosis not present

## 2015-03-09 DIAGNOSIS — D6861 Antiphospholipid syndrome: Secondary | ICD-10-CM | POA: Diagnosis not present

## 2015-03-09 DIAGNOSIS — I2699 Other pulmonary embolism without acute cor pulmonale: Secondary | ICD-10-CM | POA: Diagnosis not present

## 2015-03-11 DIAGNOSIS — H4312 Vitreous hemorrhage, left eye: Secondary | ICD-10-CM | POA: Diagnosis not present

## 2015-03-11 DIAGNOSIS — H43812 Vitreous degeneration, left eye: Secondary | ICD-10-CM | POA: Diagnosis not present

## 2015-03-23 DIAGNOSIS — D6861 Antiphospholipid syndrome: Secondary | ICD-10-CM | POA: Diagnosis not present

## 2015-03-23 DIAGNOSIS — I2699 Other pulmonary embolism without acute cor pulmonale: Secondary | ICD-10-CM | POA: Diagnosis not present

## 2015-03-30 DIAGNOSIS — I2699 Other pulmonary embolism without acute cor pulmonale: Secondary | ICD-10-CM | POA: Diagnosis not present

## 2015-03-30 DIAGNOSIS — D6861 Antiphospholipid syndrome: Secondary | ICD-10-CM | POA: Diagnosis not present

## 2015-04-06 DIAGNOSIS — D6861 Antiphospholipid syndrome: Secondary | ICD-10-CM | POA: Diagnosis not present

## 2015-04-06 DIAGNOSIS — I2699 Other pulmonary embolism without acute cor pulmonale: Secondary | ICD-10-CM | POA: Diagnosis not present

## 2015-04-07 DIAGNOSIS — D519 Vitamin B12 deficiency anemia, unspecified: Secondary | ICD-10-CM | POA: Diagnosis not present

## 2015-04-13 DIAGNOSIS — I2699 Other pulmonary embolism without acute cor pulmonale: Secondary | ICD-10-CM | POA: Diagnosis not present

## 2015-04-13 DIAGNOSIS — D6861 Antiphospholipid syndrome: Secondary | ICD-10-CM | POA: Diagnosis not present

## 2015-04-15 DIAGNOSIS — D485 Neoplasm of uncertain behavior of skin: Secondary | ICD-10-CM | POA: Diagnosis not present

## 2015-04-15 DIAGNOSIS — L570 Actinic keratosis: Secondary | ICD-10-CM | POA: Diagnosis not present

## 2015-04-15 DIAGNOSIS — D225 Melanocytic nevi of trunk: Secondary | ICD-10-CM | POA: Diagnosis not present

## 2015-04-20 DIAGNOSIS — D6861 Antiphospholipid syndrome: Secondary | ICD-10-CM | POA: Diagnosis not present

## 2015-04-20 DIAGNOSIS — I2699 Other pulmonary embolism without acute cor pulmonale: Secondary | ICD-10-CM | POA: Diagnosis not present

## 2015-04-27 DIAGNOSIS — D6861 Antiphospholipid syndrome: Secondary | ICD-10-CM | POA: Diagnosis not present

## 2015-04-27 DIAGNOSIS — I2699 Other pulmonary embolism without acute cor pulmonale: Secondary | ICD-10-CM | POA: Diagnosis not present

## 2015-05-04 DIAGNOSIS — I2699 Other pulmonary embolism without acute cor pulmonale: Secondary | ICD-10-CM | POA: Diagnosis not present

## 2015-05-04 DIAGNOSIS — D6861 Antiphospholipid syndrome: Secondary | ICD-10-CM | POA: Diagnosis not present

## 2015-05-10 DIAGNOSIS — D519 Vitamin B12 deficiency anemia, unspecified: Secondary | ICD-10-CM | POA: Diagnosis not present

## 2015-05-11 DIAGNOSIS — D6861 Antiphospholipid syndrome: Secondary | ICD-10-CM | POA: Diagnosis not present

## 2015-05-11 DIAGNOSIS — I2699 Other pulmonary embolism without acute cor pulmonale: Secondary | ICD-10-CM | POA: Diagnosis not present

## 2015-05-13 DIAGNOSIS — Z96659 Presence of unspecified artificial knee joint: Secondary | ICD-10-CM | POA: Diagnosis not present

## 2015-05-20 DIAGNOSIS — D6861 Antiphospholipid syndrome: Secondary | ICD-10-CM | POA: Diagnosis not present

## 2015-05-20 DIAGNOSIS — I10 Essential (primary) hypertension: Secondary | ICD-10-CM | POA: Diagnosis not present

## 2015-05-27 DIAGNOSIS — D510 Vitamin B12 deficiency anemia due to intrinsic factor deficiency: Secondary | ICD-10-CM | POA: Diagnosis not present

## 2015-05-27 DIAGNOSIS — D6861 Antiphospholipid syndrome: Secondary | ICD-10-CM | POA: Diagnosis not present

## 2015-06-03 DIAGNOSIS — D6861 Antiphospholipid syndrome: Secondary | ICD-10-CM | POA: Diagnosis not present

## 2015-06-10 DIAGNOSIS — D519 Vitamin B12 deficiency anemia, unspecified: Secondary | ICD-10-CM | POA: Diagnosis not present

## 2015-06-11 DIAGNOSIS — D6861 Antiphospholipid syndrome: Secondary | ICD-10-CM | POA: Diagnosis not present

## 2015-06-18 DIAGNOSIS — D6861 Antiphospholipid syndrome: Secondary | ICD-10-CM | POA: Diagnosis not present

## 2015-06-25 DIAGNOSIS — D6861 Antiphospholipid syndrome: Secondary | ICD-10-CM | POA: Diagnosis not present

## 2015-07-02 DIAGNOSIS — D6861 Antiphospholipid syndrome: Secondary | ICD-10-CM | POA: Diagnosis not present

## 2015-07-09 DIAGNOSIS — D6861 Antiphospholipid syndrome: Secondary | ICD-10-CM | POA: Diagnosis not present

## 2015-07-16 DIAGNOSIS — Z0000 Encounter for general adult medical examination without abnormal findings: Secondary | ICD-10-CM | POA: Diagnosis not present

## 2015-07-16 DIAGNOSIS — I10 Essential (primary) hypertension: Secondary | ICD-10-CM | POA: Diagnosis not present

## 2015-07-16 DIAGNOSIS — K219 Gastro-esophageal reflux disease without esophagitis: Secondary | ICD-10-CM | POA: Diagnosis not present

## 2015-07-16 DIAGNOSIS — J4520 Mild intermittent asthma, uncomplicated: Secondary | ICD-10-CM | POA: Diagnosis not present

## 2015-07-16 DIAGNOSIS — D519 Vitamin B12 deficiency anemia, unspecified: Secondary | ICD-10-CM | POA: Diagnosis not present

## 2015-07-16 DIAGNOSIS — E559 Vitamin D deficiency, unspecified: Secondary | ICD-10-CM | POA: Diagnosis not present

## 2015-07-16 DIAGNOSIS — D510 Vitamin B12 deficiency anemia due to intrinsic factor deficiency: Secondary | ICD-10-CM | POA: Diagnosis not present

## 2015-07-16 DIAGNOSIS — D6861 Antiphospholipid syndrome: Secondary | ICD-10-CM | POA: Diagnosis not present

## 2015-07-16 DIAGNOSIS — R739 Hyperglycemia, unspecified: Secondary | ICD-10-CM | POA: Diagnosis not present

## 2015-07-16 DIAGNOSIS — Z1389 Encounter for screening for other disorder: Secondary | ICD-10-CM | POA: Diagnosis not present

## 2015-07-23 DIAGNOSIS — M810 Age-related osteoporosis without current pathological fracture: Secondary | ICD-10-CM | POA: Diagnosis not present

## 2015-07-29 DIAGNOSIS — M65332 Trigger finger, left middle finger: Secondary | ICD-10-CM | POA: Diagnosis not present

## 2015-07-30 DIAGNOSIS — D6861 Antiphospholipid syndrome: Secondary | ICD-10-CM | POA: Diagnosis not present

## 2015-08-10 DIAGNOSIS — H43812 Vitreous degeneration, left eye: Secondary | ICD-10-CM | POA: Diagnosis not present

## 2015-08-10 DIAGNOSIS — Z961 Presence of intraocular lens: Secondary | ICD-10-CM | POA: Diagnosis not present

## 2015-08-13 DIAGNOSIS — D6851 Activated protein C resistance: Secondary | ICD-10-CM | POA: Diagnosis not present

## 2015-08-20 DIAGNOSIS — D519 Vitamin B12 deficiency anemia, unspecified: Secondary | ICD-10-CM | POA: Diagnosis not present

## 2015-08-27 DIAGNOSIS — D6851 Activated protein C resistance: Secondary | ICD-10-CM | POA: Diagnosis not present

## 2015-09-08 DIAGNOSIS — M65332 Trigger finger, left middle finger: Secondary | ICD-10-CM | POA: Diagnosis not present

## 2015-09-09 DIAGNOSIS — D6851 Activated protein C resistance: Secondary | ICD-10-CM | POA: Diagnosis not present

## 2015-09-22 DIAGNOSIS — D519 Vitamin B12 deficiency anemia, unspecified: Secondary | ICD-10-CM | POA: Diagnosis not present

## 2015-09-24 DIAGNOSIS — Z23 Encounter for immunization: Secondary | ICD-10-CM | POA: Diagnosis not present

## 2015-09-29 DIAGNOSIS — K219 Gastro-esophageal reflux disease without esophagitis: Secondary | ICD-10-CM | POA: Diagnosis not present

## 2015-09-29 DIAGNOSIS — R791 Abnormal coagulation profile: Secondary | ICD-10-CM | POA: Diagnosis not present

## 2015-09-29 DIAGNOSIS — D6851 Activated protein C resistance: Secondary | ICD-10-CM | POA: Diagnosis not present

## 2015-10-06 DIAGNOSIS — D6851 Activated protein C resistance: Secondary | ICD-10-CM | POA: Diagnosis not present

## 2015-10-20 DIAGNOSIS — J302 Other seasonal allergic rhinitis: Secondary | ICD-10-CM | POA: Diagnosis not present

## 2015-10-20 DIAGNOSIS — E119 Type 2 diabetes mellitus without complications: Secondary | ICD-10-CM | POA: Diagnosis not present

## 2015-10-20 DIAGNOSIS — R05 Cough: Secondary | ICD-10-CM | POA: Diagnosis not present

## 2015-10-20 DIAGNOSIS — E7800 Pure hypercholesterolemia, unspecified: Secondary | ICD-10-CM | POA: Diagnosis not present

## 2015-10-20 DIAGNOSIS — D6861 Antiphospholipid syndrome: Secondary | ICD-10-CM | POA: Diagnosis not present

## 2015-10-25 DIAGNOSIS — D519 Vitamin B12 deficiency anemia, unspecified: Secondary | ICD-10-CM | POA: Diagnosis not present

## 2015-10-26 DIAGNOSIS — L719 Rosacea, unspecified: Secondary | ICD-10-CM | POA: Diagnosis not present

## 2015-10-26 DIAGNOSIS — L570 Actinic keratosis: Secondary | ICD-10-CM | POA: Diagnosis not present

## 2015-10-27 DIAGNOSIS — Z7901 Long term (current) use of anticoagulants: Secondary | ICD-10-CM | POA: Diagnosis not present

## 2015-10-27 DIAGNOSIS — D6861 Antiphospholipid syndrome: Secondary | ICD-10-CM | POA: Diagnosis not present

## 2015-11-03 DIAGNOSIS — D6861 Antiphospholipid syndrome: Secondary | ICD-10-CM | POA: Diagnosis not present

## 2015-11-09 DIAGNOSIS — J302 Other seasonal allergic rhinitis: Secondary | ICD-10-CM | POA: Diagnosis not present

## 2015-11-09 DIAGNOSIS — R05 Cough: Secondary | ICD-10-CM | POA: Diagnosis not present

## 2015-11-09 DIAGNOSIS — D6861 Antiphospholipid syndrome: Secondary | ICD-10-CM | POA: Diagnosis not present

## 2015-11-16 DIAGNOSIS — D6861 Antiphospholipid syndrome: Secondary | ICD-10-CM | POA: Diagnosis not present

## 2015-11-23 DIAGNOSIS — D6861 Antiphospholipid syndrome: Secondary | ICD-10-CM | POA: Diagnosis not present

## 2015-11-26 DIAGNOSIS — R5383 Other fatigue: Secondary | ICD-10-CM | POA: Diagnosis not present

## 2015-11-26 DIAGNOSIS — D519 Vitamin B12 deficiency anemia, unspecified: Secondary | ICD-10-CM | POA: Diagnosis not present

## 2015-11-30 DIAGNOSIS — D6861 Antiphospholipid syndrome: Secondary | ICD-10-CM | POA: Diagnosis not present

## 2015-12-09 DIAGNOSIS — M6528 Calcific tendinitis, other site: Secondary | ICD-10-CM | POA: Diagnosis not present

## 2015-12-13 DIAGNOSIS — I509 Heart failure, unspecified: Secondary | ICD-10-CM | POA: Diagnosis not present

## 2015-12-13 DIAGNOSIS — J189 Pneumonia, unspecified organism: Secondary | ICD-10-CM | POA: Diagnosis not present

## 2015-12-14 DIAGNOSIS — D6861 Antiphospholipid syndrome: Secondary | ICD-10-CM | POA: Diagnosis not present

## 2015-12-20 DIAGNOSIS — D6861 Antiphospholipid syndrome: Secondary | ICD-10-CM | POA: Diagnosis not present

## 2015-12-28 DIAGNOSIS — D519 Vitamin B12 deficiency anemia, unspecified: Secondary | ICD-10-CM | POA: Diagnosis not present

## 2015-12-29 DIAGNOSIS — D6861 Antiphospholipid syndrome: Secondary | ICD-10-CM | POA: Diagnosis not present

## 2016-01-05 DIAGNOSIS — H35342 Macular cyst, hole, or pseudohole, left eye: Secondary | ICD-10-CM | POA: Diagnosis not present

## 2016-01-05 DIAGNOSIS — D6861 Antiphospholipid syndrome: Secondary | ICD-10-CM | POA: Diagnosis not present

## 2016-01-05 DIAGNOSIS — I10 Essential (primary) hypertension: Secondary | ICD-10-CM | POA: Diagnosis not present

## 2016-01-12 DIAGNOSIS — D6861 Antiphospholipid syndrome: Secondary | ICD-10-CM | POA: Diagnosis not present

## 2016-01-19 DIAGNOSIS — D6861 Antiphospholipid syndrome: Secondary | ICD-10-CM | POA: Diagnosis not present

## 2016-01-26 DIAGNOSIS — D6861 Antiphospholipid syndrome: Secondary | ICD-10-CM | POA: Diagnosis not present

## 2016-01-31 DIAGNOSIS — D519 Vitamin B12 deficiency anemia, unspecified: Secondary | ICD-10-CM | POA: Diagnosis not present

## 2016-02-03 DIAGNOSIS — M6528 Calcific tendinitis, other site: Secondary | ICD-10-CM | POA: Diagnosis not present

## 2016-02-03 DIAGNOSIS — M19042 Primary osteoarthritis, left hand: Secondary | ICD-10-CM | POA: Diagnosis not present

## 2016-02-03 DIAGNOSIS — D6861 Antiphospholipid syndrome: Secondary | ICD-10-CM | POA: Diagnosis not present

## 2016-02-11 DIAGNOSIS — D6861 Antiphospholipid syndrome: Secondary | ICD-10-CM | POA: Diagnosis not present

## 2016-02-18 DIAGNOSIS — R062 Wheezing: Secondary | ICD-10-CM | POA: Diagnosis not present

## 2016-02-18 DIAGNOSIS — R05 Cough: Secondary | ICD-10-CM | POA: Diagnosis not present

## 2016-02-18 DIAGNOSIS — D6861 Antiphospholipid syndrome: Secondary | ICD-10-CM | POA: Diagnosis not present

## 2016-02-25 DIAGNOSIS — D6861 Antiphospholipid syndrome: Secondary | ICD-10-CM | POA: Diagnosis not present

## 2016-02-25 DIAGNOSIS — R0602 Shortness of breath: Secondary | ICD-10-CM | POA: Diagnosis not present

## 2016-02-25 DIAGNOSIS — R05 Cough: Secondary | ICD-10-CM | POA: Diagnosis not present

## 2016-02-28 DIAGNOSIS — H35342 Macular cyst, hole, or pseudohole, left eye: Secondary | ICD-10-CM | POA: Diagnosis not present

## 2016-03-03 DIAGNOSIS — D6861 Antiphospholipid syndrome: Secondary | ICD-10-CM | POA: Diagnosis not present

## 2016-03-06 DIAGNOSIS — D519 Vitamin B12 deficiency anemia, unspecified: Secondary | ICD-10-CM | POA: Diagnosis not present

## 2016-03-10 DIAGNOSIS — D6861 Antiphospholipid syndrome: Secondary | ICD-10-CM | POA: Diagnosis not present

## 2016-03-17 DIAGNOSIS — D6861 Antiphospholipid syndrome: Secondary | ICD-10-CM | POA: Diagnosis not present

## 2016-03-17 DIAGNOSIS — J40 Bronchitis, not specified as acute or chronic: Secondary | ICD-10-CM | POA: Diagnosis not present

## 2016-03-17 DIAGNOSIS — R05 Cough: Secondary | ICD-10-CM | POA: Diagnosis not present

## 2016-03-22 DIAGNOSIS — D591 Other autoimmune hemolytic anemias: Secondary | ICD-10-CM | POA: Diagnosis not present

## 2016-03-22 DIAGNOSIS — Z9081 Acquired absence of spleen: Secondary | ICD-10-CM | POA: Diagnosis not present

## 2016-03-22 DIAGNOSIS — Z7901 Long term (current) use of anticoagulants: Secondary | ICD-10-CM | POA: Diagnosis not present

## 2016-03-22 DIAGNOSIS — D6861 Antiphospholipid syndrome: Secondary | ICD-10-CM | POA: Diagnosis not present

## 2016-03-24 DIAGNOSIS — D6861 Antiphospholipid syndrome: Secondary | ICD-10-CM | POA: Diagnosis not present

## 2016-03-30 DIAGNOSIS — H26492 Other secondary cataract, left eye: Secondary | ICD-10-CM | POA: Diagnosis not present

## 2016-03-30 DIAGNOSIS — H35342 Macular cyst, hole, or pseudohole, left eye: Secondary | ICD-10-CM | POA: Diagnosis not present

## 2016-03-31 DIAGNOSIS — D6861 Antiphospholipid syndrome: Secondary | ICD-10-CM | POA: Diagnosis not present

## 2016-04-06 DIAGNOSIS — D6861 Antiphospholipid syndrome: Secondary | ICD-10-CM | POA: Diagnosis not present

## 2016-04-10 DIAGNOSIS — K219 Gastro-esophageal reflux disease without esophagitis: Secondary | ICD-10-CM | POA: Diagnosis not present

## 2016-04-10 DIAGNOSIS — E7800 Pure hypercholesterolemia, unspecified: Secondary | ICD-10-CM | POA: Diagnosis not present

## 2016-04-10 DIAGNOSIS — E1169 Type 2 diabetes mellitus with other specified complication: Secondary | ICD-10-CM | POA: Diagnosis not present

## 2016-04-10 DIAGNOSIS — D510 Vitamin B12 deficiency anemia due to intrinsic factor deficiency: Secondary | ICD-10-CM | POA: Diagnosis not present

## 2016-04-10 DIAGNOSIS — I10 Essential (primary) hypertension: Secondary | ICD-10-CM | POA: Diagnosis not present

## 2016-04-10 DIAGNOSIS — J4520 Mild intermittent asthma, uncomplicated: Secondary | ICD-10-CM | POA: Diagnosis not present

## 2016-04-13 DIAGNOSIS — H35342 Macular cyst, hole, or pseudohole, left eye: Secondary | ICD-10-CM | POA: Diagnosis not present

## 2016-05-08 DIAGNOSIS — E1169 Type 2 diabetes mellitus with other specified complication: Secondary | ICD-10-CM | POA: Diagnosis not present

## 2016-05-08 DIAGNOSIS — Z0000 Encounter for general adult medical examination without abnormal findings: Secondary | ICD-10-CM | POA: Diagnosis not present

## 2016-05-08 DIAGNOSIS — Z7901 Long term (current) use of anticoagulants: Secondary | ICD-10-CM | POA: Diagnosis not present

## 2016-05-08 DIAGNOSIS — D6861 Antiphospholipid syndrome: Secondary | ICD-10-CM | POA: Diagnosis not present

## 2016-05-08 DIAGNOSIS — J4520 Mild intermittent asthma, uncomplicated: Secondary | ICD-10-CM | POA: Diagnosis not present

## 2016-05-08 DIAGNOSIS — E785 Hyperlipidemia, unspecified: Secondary | ICD-10-CM | POA: Diagnosis not present

## 2016-05-08 DIAGNOSIS — Z79899 Other long term (current) drug therapy: Secondary | ICD-10-CM | POA: Diagnosis not present

## 2016-05-08 DIAGNOSIS — D510 Vitamin B12 deficiency anemia due to intrinsic factor deficiency: Secondary | ICD-10-CM | POA: Diagnosis not present

## 2016-05-20 DIAGNOSIS — L821 Other seborrheic keratosis: Secondary | ICD-10-CM | POA: Diagnosis not present

## 2016-05-20 DIAGNOSIS — L300 Nummular dermatitis: Secondary | ICD-10-CM | POA: Diagnosis not present

## 2016-05-20 DIAGNOSIS — D485 Neoplasm of uncertain behavior of skin: Secondary | ICD-10-CM | POA: Diagnosis not present

## 2016-05-20 DIAGNOSIS — L299 Pruritus, unspecified: Secondary | ICD-10-CM | POA: Diagnosis not present

## 2016-05-20 DIAGNOSIS — L570 Actinic keratosis: Secondary | ICD-10-CM | POA: Diagnosis not present

## 2016-05-25 DIAGNOSIS — H35372 Puckering of macula, left eye: Secondary | ICD-10-CM | POA: Diagnosis not present

## 2016-06-09 DIAGNOSIS — Z7901 Long term (current) use of anticoagulants: Secondary | ICD-10-CM | POA: Diagnosis not present

## 2016-06-12 DIAGNOSIS — D510 Vitamin B12 deficiency anemia due to intrinsic factor deficiency: Secondary | ICD-10-CM | POA: Diagnosis not present

## 2016-07-06 DIAGNOSIS — H35372 Puckering of macula, left eye: Secondary | ICD-10-CM | POA: Diagnosis not present

## 2016-07-10 DIAGNOSIS — Z7901 Long term (current) use of anticoagulants: Secondary | ICD-10-CM | POA: Diagnosis not present

## 2016-07-17 DIAGNOSIS — D510 Vitamin B12 deficiency anemia due to intrinsic factor deficiency: Secondary | ICD-10-CM | POA: Diagnosis not present

## 2016-08-08 DIAGNOSIS — Z7901 Long term (current) use of anticoagulants: Secondary | ICD-10-CM | POA: Diagnosis not present

## 2016-08-08 DIAGNOSIS — E119 Type 2 diabetes mellitus without complications: Secondary | ICD-10-CM | POA: Diagnosis not present

## 2016-08-10 DIAGNOSIS — H35372 Puckering of macula, left eye: Secondary | ICD-10-CM | POA: Diagnosis not present

## 2016-08-10 DIAGNOSIS — H524 Presbyopia: Secondary | ICD-10-CM | POA: Diagnosis not present

## 2016-08-17 DIAGNOSIS — D510 Vitamin B12 deficiency anemia due to intrinsic factor deficiency: Secondary | ICD-10-CM | POA: Diagnosis not present

## 2016-08-17 DIAGNOSIS — E7800 Pure hypercholesterolemia, unspecified: Secondary | ICD-10-CM | POA: Diagnosis not present

## 2016-08-17 DIAGNOSIS — D6861 Antiphospholipid syndrome: Secondary | ICD-10-CM | POA: Diagnosis not present

## 2016-08-17 DIAGNOSIS — Z7901 Long term (current) use of anticoagulants: Secondary | ICD-10-CM | POA: Diagnosis not present

## 2016-08-17 DIAGNOSIS — E1169 Type 2 diabetes mellitus with other specified complication: Secondary | ICD-10-CM | POA: Diagnosis not present

## 2016-08-24 DIAGNOSIS — Z7901 Long term (current) use of anticoagulants: Secondary | ICD-10-CM | POA: Diagnosis not present

## 2016-08-25 DIAGNOSIS — B9689 Other specified bacterial agents as the cause of diseases classified elsewhere: Secondary | ICD-10-CM | POA: Diagnosis not present

## 2016-08-25 DIAGNOSIS — H66004 Acute suppurative otitis media without spontaneous rupture of ear drum, recurrent, right ear: Secondary | ICD-10-CM | POA: Diagnosis not present

## 2016-08-25 DIAGNOSIS — J019 Acute sinusitis, unspecified: Secondary | ICD-10-CM | POA: Diagnosis not present

## 2016-09-05 DIAGNOSIS — R05 Cough: Secondary | ICD-10-CM | POA: Diagnosis not present

## 2016-09-05 DIAGNOSIS — Z7901 Long term (current) use of anticoagulants: Secondary | ICD-10-CM | POA: Diagnosis not present

## 2016-09-18 DIAGNOSIS — D510 Vitamin B12 deficiency anemia due to intrinsic factor deficiency: Secondary | ICD-10-CM | POA: Diagnosis not present

## 2016-09-21 DIAGNOSIS — Z7901 Long term (current) use of anticoagulants: Secondary | ICD-10-CM | POA: Diagnosis not present

## 2016-10-19 DIAGNOSIS — Z23 Encounter for immunization: Secondary | ICD-10-CM | POA: Diagnosis not present

## 2016-10-19 DIAGNOSIS — Z7901 Long term (current) use of anticoagulants: Secondary | ICD-10-CM | POA: Diagnosis not present

## 2016-10-19 DIAGNOSIS — D510 Vitamin B12 deficiency anemia due to intrinsic factor deficiency: Secondary | ICD-10-CM | POA: Diagnosis not present

## 2016-10-30 DIAGNOSIS — I2692 Saddle embolus of pulmonary artery without acute cor pulmonale: Secondary | ICD-10-CM | POA: Diagnosis not present

## 2016-10-30 DIAGNOSIS — D6861 Antiphospholipid syndrome: Secondary | ICD-10-CM | POA: Diagnosis not present

## 2016-10-30 DIAGNOSIS — D510 Vitamin B12 deficiency anemia due to intrinsic factor deficiency: Secondary | ICD-10-CM | POA: Diagnosis not present

## 2016-10-30 DIAGNOSIS — E1169 Type 2 diabetes mellitus with other specified complication: Secondary | ICD-10-CM | POA: Diagnosis not present

## 2016-10-30 DIAGNOSIS — E7800 Pure hypercholesterolemia, unspecified: Secondary | ICD-10-CM | POA: Diagnosis not present

## 2016-11-15 DIAGNOSIS — H35342 Macular cyst, hole, or pseudohole, left eye: Secondary | ICD-10-CM | POA: Diagnosis not present

## 2016-11-20 DIAGNOSIS — E785 Hyperlipidemia, unspecified: Secondary | ICD-10-CM | POA: Diagnosis not present

## 2016-11-20 DIAGNOSIS — E7800 Pure hypercholesterolemia, unspecified: Secondary | ICD-10-CM | POA: Diagnosis not present

## 2016-11-20 DIAGNOSIS — Z7901 Long term (current) use of anticoagulants: Secondary | ICD-10-CM | POA: Diagnosis not present

## 2016-11-20 DIAGNOSIS — E1169 Type 2 diabetes mellitus with other specified complication: Secondary | ICD-10-CM | POA: Diagnosis not present

## 2016-11-20 DIAGNOSIS — Z79899 Other long term (current) drug therapy: Secondary | ICD-10-CM | POA: Diagnosis not present

## 2016-11-20 DIAGNOSIS — D510 Vitamin B12 deficiency anemia due to intrinsic factor deficiency: Secondary | ICD-10-CM | POA: Diagnosis not present

## 2016-12-04 DIAGNOSIS — Z7901 Long term (current) use of anticoagulants: Secondary | ICD-10-CM | POA: Diagnosis not present

## 2016-12-11 DIAGNOSIS — Z139 Encounter for screening, unspecified: Secondary | ICD-10-CM | POA: Diagnosis not present

## 2016-12-11 DIAGNOSIS — Z7901 Long term (current) use of anticoagulants: Secondary | ICD-10-CM | POA: Diagnosis not present

## 2016-12-11 DIAGNOSIS — E876 Hypokalemia: Secondary | ICD-10-CM | POA: Diagnosis not present

## 2016-12-11 DIAGNOSIS — I10 Essential (primary) hypertension: Secondary | ICD-10-CM | POA: Diagnosis not present

## 2016-12-21 DIAGNOSIS — Z1211 Encounter for screening for malignant neoplasm of colon: Secondary | ICD-10-CM | POA: Diagnosis not present

## 2016-12-21 DIAGNOSIS — D510 Vitamin B12 deficiency anemia due to intrinsic factor deficiency: Secondary | ICD-10-CM | POA: Diagnosis not present

## 2016-12-21 DIAGNOSIS — Z1231 Encounter for screening mammogram for malignant neoplasm of breast: Secondary | ICD-10-CM | POA: Diagnosis not present

## 2016-12-21 DIAGNOSIS — Z9181 History of falling: Secondary | ICD-10-CM | POA: Diagnosis not present

## 2016-12-21 DIAGNOSIS — Z0000 Encounter for general adult medical examination without abnormal findings: Secondary | ICD-10-CM | POA: Diagnosis not present

## 2016-12-21 DIAGNOSIS — Z1331 Encounter for screening for depression: Secondary | ICD-10-CM | POA: Diagnosis not present

## 2016-12-21 DIAGNOSIS — E785 Hyperlipidemia, unspecified: Secondary | ICD-10-CM | POA: Diagnosis not present

## 2016-12-21 DIAGNOSIS — N959 Unspecified menopausal and perimenopausal disorder: Secondary | ICD-10-CM | POA: Diagnosis not present

## 2016-12-25 DIAGNOSIS — R791 Abnormal coagulation profile: Secondary | ICD-10-CM | POA: Diagnosis not present

## 2017-01-02 DIAGNOSIS — R791 Abnormal coagulation profile: Secondary | ICD-10-CM | POA: Diagnosis not present

## 2017-01-15 DIAGNOSIS — R791 Abnormal coagulation profile: Secondary | ICD-10-CM | POA: Diagnosis not present

## 2017-01-18 DIAGNOSIS — H35342 Macular cyst, hole, or pseudohole, left eye: Secondary | ICD-10-CM | POA: Diagnosis not present

## 2017-01-22 DIAGNOSIS — D510 Vitamin B12 deficiency anemia due to intrinsic factor deficiency: Secondary | ICD-10-CM | POA: Diagnosis not present

## 2017-02-16 DIAGNOSIS — Z7901 Long term (current) use of anticoagulants: Secondary | ICD-10-CM | POA: Diagnosis not present

## 2017-02-23 DIAGNOSIS — D510 Vitamin B12 deficiency anemia due to intrinsic factor deficiency: Secondary | ICD-10-CM | POA: Diagnosis not present

## 2017-02-27 DIAGNOSIS — E119 Type 2 diabetes mellitus without complications: Secondary | ICD-10-CM | POA: Diagnosis not present

## 2017-02-27 DIAGNOSIS — K5900 Constipation, unspecified: Secondary | ICD-10-CM | POA: Diagnosis not present

## 2017-02-27 DIAGNOSIS — I10 Essential (primary) hypertension: Secondary | ICD-10-CM | POA: Diagnosis not present

## 2017-02-27 DIAGNOSIS — E785 Hyperlipidemia, unspecified: Secondary | ICD-10-CM | POA: Diagnosis not present

## 2017-02-27 DIAGNOSIS — Z79899 Other long term (current) drug therapy: Secondary | ICD-10-CM | POA: Diagnosis not present

## 2017-03-05 DIAGNOSIS — J209 Acute bronchitis, unspecified: Secondary | ICD-10-CM | POA: Diagnosis not present

## 2017-03-13 DIAGNOSIS — J209 Acute bronchitis, unspecified: Secondary | ICD-10-CM | POA: Diagnosis not present

## 2017-03-13 DIAGNOSIS — R791 Abnormal coagulation profile: Secondary | ICD-10-CM | POA: Diagnosis not present

## 2017-03-23 DIAGNOSIS — R791 Abnormal coagulation profile: Secondary | ICD-10-CM | POA: Diagnosis not present

## 2017-03-27 DIAGNOSIS — D519 Vitamin B12 deficiency anemia, unspecified: Secondary | ICD-10-CM | POA: Diagnosis not present

## 2017-04-06 DIAGNOSIS — R791 Abnormal coagulation profile: Secondary | ICD-10-CM | POA: Diagnosis not present

## 2017-04-16 DIAGNOSIS — E119 Type 2 diabetes mellitus without complications: Secondary | ICD-10-CM | POA: Diagnosis not present

## 2017-04-16 DIAGNOSIS — R05 Cough: Secondary | ICD-10-CM | POA: Diagnosis not present

## 2017-04-16 DIAGNOSIS — R0602 Shortness of breath: Secondary | ICD-10-CM | POA: Diagnosis not present

## 2017-04-30 DIAGNOSIS — R791 Abnormal coagulation profile: Secondary | ICD-10-CM | POA: Diagnosis not present

## 2017-04-30 DIAGNOSIS — D510 Vitamin B12 deficiency anemia due to intrinsic factor deficiency: Secondary | ICD-10-CM | POA: Diagnosis not present

## 2017-04-30 DIAGNOSIS — E119 Type 2 diabetes mellitus without complications: Secondary | ICD-10-CM | POA: Diagnosis not present

## 2017-04-30 DIAGNOSIS — J189 Pneumonia, unspecified organism: Secondary | ICD-10-CM | POA: Diagnosis not present

## 2017-05-09 DIAGNOSIS — S8002XS Contusion of left knee, sequela: Secondary | ICD-10-CM | POA: Diagnosis not present

## 2017-05-14 DIAGNOSIS — R791 Abnormal coagulation profile: Secondary | ICD-10-CM | POA: Diagnosis not present

## 2017-05-18 DIAGNOSIS — R600 Localized edema: Secondary | ICD-10-CM | POA: Diagnosis not present

## 2017-05-18 DIAGNOSIS — M79606 Pain in leg, unspecified: Secondary | ICD-10-CM | POA: Diagnosis not present

## 2017-05-18 DIAGNOSIS — R791 Abnormal coagulation profile: Secondary | ICD-10-CM | POA: Diagnosis not present

## 2017-05-29 DIAGNOSIS — E559 Vitamin D deficiency, unspecified: Secondary | ICD-10-CM | POA: Diagnosis not present

## 2017-05-29 DIAGNOSIS — D510 Vitamin B12 deficiency anemia due to intrinsic factor deficiency: Secondary | ICD-10-CM | POA: Diagnosis not present

## 2017-05-29 DIAGNOSIS — E785 Hyperlipidemia, unspecified: Secondary | ICD-10-CM | POA: Diagnosis not present

## 2017-05-29 DIAGNOSIS — I10 Essential (primary) hypertension: Secondary | ICD-10-CM | POA: Diagnosis not present

## 2017-05-29 DIAGNOSIS — E119 Type 2 diabetes mellitus without complications: Secondary | ICD-10-CM | POA: Diagnosis not present

## 2017-05-29 DIAGNOSIS — R791 Abnormal coagulation profile: Secondary | ICD-10-CM | POA: Diagnosis not present

## 2017-05-30 DIAGNOSIS — S8002XS Contusion of left knee, sequela: Secondary | ICD-10-CM | POA: Diagnosis not present

## 2017-06-12 DIAGNOSIS — Z791 Long term (current) use of non-steroidal anti-inflammatories (NSAID): Secondary | ICD-10-CM | POA: Diagnosis not present

## 2017-06-19 DIAGNOSIS — H35342 Macular cyst, hole, or pseudohole, left eye: Secondary | ICD-10-CM | POA: Diagnosis not present

## 2017-06-19 DIAGNOSIS — R791 Abnormal coagulation profile: Secondary | ICD-10-CM | POA: Diagnosis not present

## 2017-07-03 DIAGNOSIS — R791 Abnormal coagulation profile: Secondary | ICD-10-CM | POA: Diagnosis not present

## 2017-07-10 DIAGNOSIS — R791 Abnormal coagulation profile: Secondary | ICD-10-CM | POA: Diagnosis not present

## 2017-07-11 DIAGNOSIS — S00419A Abrasion of unspecified ear, initial encounter: Secondary | ICD-10-CM | POA: Diagnosis not present

## 2017-07-11 DIAGNOSIS — K5900 Constipation, unspecified: Secondary | ICD-10-CM | POA: Diagnosis not present

## 2017-07-24 DIAGNOSIS — D510 Vitamin B12 deficiency anemia due to intrinsic factor deficiency: Secondary | ICD-10-CM | POA: Diagnosis not present

## 2017-07-24 DIAGNOSIS — R791 Abnormal coagulation profile: Secondary | ICD-10-CM | POA: Diagnosis not present

## 2017-08-07 DIAGNOSIS — R791 Abnormal coagulation profile: Secondary | ICD-10-CM | POA: Diagnosis not present

## 2017-08-14 DIAGNOSIS — J209 Acute bronchitis, unspecified: Secondary | ICD-10-CM | POA: Diagnosis not present

## 2017-08-21 DIAGNOSIS — J209 Acute bronchitis, unspecified: Secondary | ICD-10-CM | POA: Diagnosis not present

## 2017-08-21 DIAGNOSIS — J302 Other seasonal allergic rhinitis: Secondary | ICD-10-CM | POA: Diagnosis not present

## 2017-08-21 DIAGNOSIS — R791 Abnormal coagulation profile: Secondary | ICD-10-CM | POA: Diagnosis not present

## 2017-09-04 DIAGNOSIS — Z79899 Other long term (current) drug therapy: Secondary | ICD-10-CM | POA: Diagnosis not present

## 2017-09-04 DIAGNOSIS — E785 Hyperlipidemia, unspecified: Secondary | ICD-10-CM | POA: Diagnosis not present

## 2017-09-04 DIAGNOSIS — E559 Vitamin D deficiency, unspecified: Secondary | ICD-10-CM | POA: Diagnosis not present

## 2017-09-04 DIAGNOSIS — E119 Type 2 diabetes mellitus without complications: Secondary | ICD-10-CM | POA: Diagnosis not present

## 2017-09-04 DIAGNOSIS — I10 Essential (primary) hypertension: Secondary | ICD-10-CM | POA: Diagnosis not present

## 2017-09-04 DIAGNOSIS — E538 Deficiency of other specified B group vitamins: Secondary | ICD-10-CM | POA: Diagnosis not present

## 2017-09-04 DIAGNOSIS — Z7901 Long term (current) use of anticoagulants: Secondary | ICD-10-CM | POA: Diagnosis not present

## 2017-09-18 DIAGNOSIS — R791 Abnormal coagulation profile: Secondary | ICD-10-CM | POA: Diagnosis not present

## 2017-09-18 DIAGNOSIS — H5462 Unqualified visual loss, left eye, normal vision right eye: Secondary | ICD-10-CM | POA: Diagnosis not present

## 2017-09-18 DIAGNOSIS — R2689 Other abnormalities of gait and mobility: Secondary | ICD-10-CM | POA: Diagnosis not present

## 2017-09-18 DIAGNOSIS — R42 Dizziness and giddiness: Secondary | ICD-10-CM | POA: Diagnosis not present

## 2017-09-20 DIAGNOSIS — R42 Dizziness and giddiness: Secondary | ICD-10-CM | POA: Diagnosis not present

## 2017-09-20 DIAGNOSIS — I6782 Cerebral ischemia: Secondary | ICD-10-CM | POA: Diagnosis not present

## 2017-09-20 DIAGNOSIS — G319 Degenerative disease of nervous system, unspecified: Secondary | ICD-10-CM | POA: Diagnosis not present

## 2017-10-04 DIAGNOSIS — Z961 Presence of intraocular lens: Secondary | ICD-10-CM | POA: Diagnosis not present

## 2017-10-04 DIAGNOSIS — H35372 Puckering of macula, left eye: Secondary | ICD-10-CM | POA: Diagnosis not present

## 2017-10-05 DIAGNOSIS — D510 Vitamin B12 deficiency anemia due to intrinsic factor deficiency: Secondary | ICD-10-CM | POA: Diagnosis not present

## 2017-10-05 DIAGNOSIS — R791 Abnormal coagulation profile: Secondary | ICD-10-CM | POA: Diagnosis not present

## 2017-10-12 DIAGNOSIS — R791 Abnormal coagulation profile: Secondary | ICD-10-CM | POA: Diagnosis not present

## 2017-10-18 DIAGNOSIS — R791 Abnormal coagulation profile: Secondary | ICD-10-CM | POA: Diagnosis not present

## 2017-10-23 DIAGNOSIS — H3581 Retinal edema: Secondary | ICD-10-CM | POA: Diagnosis not present

## 2017-10-23 DIAGNOSIS — H43813 Vitreous degeneration, bilateral: Secondary | ICD-10-CM | POA: Diagnosis not present

## 2017-10-23 DIAGNOSIS — E113293 Type 2 diabetes mellitus with mild nonproliferative diabetic retinopathy without macular edema, bilateral: Secondary | ICD-10-CM | POA: Diagnosis not present

## 2017-10-23 DIAGNOSIS — H35373 Puckering of macula, bilateral: Secondary | ICD-10-CM | POA: Diagnosis not present

## 2017-10-24 DIAGNOSIS — Z01818 Encounter for other preprocedural examination: Secondary | ICD-10-CM | POA: Diagnosis not present

## 2017-10-25 DIAGNOSIS — R791 Abnormal coagulation profile: Secondary | ICD-10-CM | POA: Diagnosis not present

## 2017-11-01 DIAGNOSIS — R791 Abnormal coagulation profile: Secondary | ICD-10-CM | POA: Diagnosis not present

## 2017-11-09 DIAGNOSIS — R791 Abnormal coagulation profile: Secondary | ICD-10-CM | POA: Diagnosis not present

## 2017-11-09 DIAGNOSIS — D509 Iron deficiency anemia, unspecified: Secondary | ICD-10-CM | POA: Diagnosis not present

## 2017-11-12 DIAGNOSIS — H26492 Other secondary cataract, left eye: Secondary | ICD-10-CM | POA: Diagnosis not present

## 2017-11-12 DIAGNOSIS — H35372 Puckering of macula, left eye: Secondary | ICD-10-CM | POA: Diagnosis not present

## 2017-11-12 DIAGNOSIS — H3581 Retinal edema: Secondary | ICD-10-CM | POA: Diagnosis not present

## 2017-11-13 DIAGNOSIS — H3581 Retinal edema: Secondary | ICD-10-CM | POA: Diagnosis not present

## 2017-11-13 DIAGNOSIS — R791 Abnormal coagulation profile: Secondary | ICD-10-CM | POA: Diagnosis not present

## 2017-11-13 DIAGNOSIS — H35373 Puckering of macula, bilateral: Secondary | ICD-10-CM | POA: Diagnosis not present

## 2017-11-16 DIAGNOSIS — R791 Abnormal coagulation profile: Secondary | ICD-10-CM | POA: Diagnosis not present

## 2017-11-19 DIAGNOSIS — R791 Abnormal coagulation profile: Secondary | ICD-10-CM | POA: Diagnosis not present

## 2017-11-20 DIAGNOSIS — H35372 Puckering of macula, left eye: Secondary | ICD-10-CM | POA: Diagnosis not present

## 2017-11-23 DIAGNOSIS — R791 Abnormal coagulation profile: Secondary | ICD-10-CM | POA: Diagnosis not present

## 2017-11-23 DIAGNOSIS — Z23 Encounter for immunization: Secondary | ICD-10-CM | POA: Diagnosis not present

## 2017-11-30 DIAGNOSIS — R791 Abnormal coagulation profile: Secondary | ICD-10-CM | POA: Diagnosis not present

## 2017-12-05 DIAGNOSIS — I10 Essential (primary) hypertension: Secondary | ICD-10-CM | POA: Diagnosis not present

## 2017-12-05 DIAGNOSIS — D509 Iron deficiency anemia, unspecified: Secondary | ICD-10-CM | POA: Diagnosis not present

## 2017-12-05 DIAGNOSIS — Z79899 Other long term (current) drug therapy: Secondary | ICD-10-CM | POA: Diagnosis not present

## 2017-12-05 DIAGNOSIS — E119 Type 2 diabetes mellitus without complications: Secondary | ICD-10-CM | POA: Diagnosis not present

## 2017-12-05 DIAGNOSIS — E559 Vitamin D deficiency, unspecified: Secondary | ICD-10-CM | POA: Diagnosis not present

## 2017-12-05 DIAGNOSIS — E785 Hyperlipidemia, unspecified: Secondary | ICD-10-CM | POA: Diagnosis not present

## 2017-12-11 DIAGNOSIS — I10 Essential (primary) hypertension: Secondary | ICD-10-CM | POA: Diagnosis not present

## 2017-12-11 DIAGNOSIS — D510 Vitamin B12 deficiency anemia due to intrinsic factor deficiency: Secondary | ICD-10-CM | POA: Diagnosis not present

## 2017-12-11 DIAGNOSIS — H3581 Retinal edema: Secondary | ICD-10-CM | POA: Diagnosis not present

## 2017-12-14 DIAGNOSIS — R791 Abnormal coagulation profile: Secondary | ICD-10-CM | POA: Diagnosis not present

## 2017-12-21 DIAGNOSIS — R791 Abnormal coagulation profile: Secondary | ICD-10-CM | POA: Diagnosis not present

## 2017-12-25 DIAGNOSIS — Z9181 History of falling: Secondary | ICD-10-CM | POA: Diagnosis not present

## 2017-12-25 DIAGNOSIS — Z139 Encounter for screening, unspecified: Secondary | ICD-10-CM | POA: Diagnosis not present

## 2017-12-25 DIAGNOSIS — Z0000 Encounter for general adult medical examination without abnormal findings: Secondary | ICD-10-CM | POA: Diagnosis not present

## 2017-12-25 DIAGNOSIS — E785 Hyperlipidemia, unspecified: Secondary | ICD-10-CM | POA: Diagnosis not present

## 2018-01-04 DIAGNOSIS — R791 Abnormal coagulation profile: Secondary | ICD-10-CM | POA: Diagnosis not present

## 2018-01-10 DIAGNOSIS — D510 Vitamin B12 deficiency anemia due to intrinsic factor deficiency: Secondary | ICD-10-CM | POA: Diagnosis not present

## 2018-01-18 DIAGNOSIS — R791 Abnormal coagulation profile: Secondary | ICD-10-CM | POA: Diagnosis not present

## 2018-02-01 DIAGNOSIS — R791 Abnormal coagulation profile: Secondary | ICD-10-CM | POA: Diagnosis not present

## 2018-02-08 DIAGNOSIS — R791 Abnormal coagulation profile: Secondary | ICD-10-CM | POA: Diagnosis not present

## 2018-02-11 DIAGNOSIS — D510 Vitamin B12 deficiency anemia due to intrinsic factor deficiency: Secondary | ICD-10-CM | POA: Diagnosis not present

## 2018-02-20 DIAGNOSIS — R791 Abnormal coagulation profile: Secondary | ICD-10-CM | POA: Diagnosis not present

## 2018-03-05 DIAGNOSIS — R791 Abnormal coagulation profile: Secondary | ICD-10-CM | POA: Diagnosis not present

## 2018-03-14 DIAGNOSIS — I10 Essential (primary) hypertension: Secondary | ICD-10-CM | POA: Diagnosis not present

## 2018-03-14 DIAGNOSIS — E119 Type 2 diabetes mellitus without complications: Secondary | ICD-10-CM | POA: Diagnosis not present

## 2018-03-14 DIAGNOSIS — E785 Hyperlipidemia, unspecified: Secondary | ICD-10-CM | POA: Diagnosis not present

## 2018-03-14 DIAGNOSIS — E538 Deficiency of other specified B group vitamins: Secondary | ICD-10-CM | POA: Diagnosis not present

## 2018-03-14 DIAGNOSIS — E559 Vitamin D deficiency, unspecified: Secondary | ICD-10-CM | POA: Diagnosis not present

## 2018-03-19 DIAGNOSIS — R791 Abnormal coagulation profile: Secondary | ICD-10-CM | POA: Diagnosis not present

## 2018-03-20 DIAGNOSIS — R791 Abnormal coagulation profile: Secondary | ICD-10-CM | POA: Diagnosis not present

## 2018-03-22 DIAGNOSIS — J302 Other seasonal allergic rhinitis: Secondary | ICD-10-CM | POA: Diagnosis not present

## 2018-03-22 DIAGNOSIS — R791 Abnormal coagulation profile: Secondary | ICD-10-CM | POA: Diagnosis not present

## 2018-03-29 DIAGNOSIS — R791 Abnormal coagulation profile: Secondary | ICD-10-CM | POA: Diagnosis not present

## 2018-04-05 DIAGNOSIS — R791 Abnormal coagulation profile: Secondary | ICD-10-CM | POA: Diagnosis not present

## 2018-04-12 DIAGNOSIS — R791 Abnormal coagulation profile: Secondary | ICD-10-CM | POA: Diagnosis not present

## 2018-04-19 DIAGNOSIS — R791 Abnormal coagulation profile: Secondary | ICD-10-CM | POA: Diagnosis not present

## 2018-04-26 DIAGNOSIS — R791 Abnormal coagulation profile: Secondary | ICD-10-CM | POA: Diagnosis not present

## 2018-05-02 DIAGNOSIS — R791 Abnormal coagulation profile: Secondary | ICD-10-CM | POA: Diagnosis not present

## 2018-05-10 DIAGNOSIS — R791 Abnormal coagulation profile: Secondary | ICD-10-CM | POA: Diagnosis not present

## 2018-05-27 DIAGNOSIS — D510 Vitamin B12 deficiency anemia due to intrinsic factor deficiency: Secondary | ICD-10-CM | POA: Diagnosis not present

## 2018-05-27 DIAGNOSIS — R791 Abnormal coagulation profile: Secondary | ICD-10-CM | POA: Diagnosis not present

## 2018-06-10 DIAGNOSIS — R791 Abnormal coagulation profile: Secondary | ICD-10-CM | POA: Diagnosis not present

## 2018-06-18 DIAGNOSIS — D591 Other autoimmune hemolytic anemias: Secondary | ICD-10-CM | POA: Diagnosis not present

## 2018-06-18 DIAGNOSIS — R791 Abnormal coagulation profile: Secondary | ICD-10-CM | POA: Diagnosis not present

## 2018-06-18 DIAGNOSIS — Z23 Encounter for immunization: Secondary | ICD-10-CM | POA: Diagnosis not present

## 2018-06-19 DIAGNOSIS — E559 Vitamin D deficiency, unspecified: Secondary | ICD-10-CM | POA: Diagnosis not present

## 2018-06-19 DIAGNOSIS — E785 Hyperlipidemia, unspecified: Secondary | ICD-10-CM | POA: Diagnosis not present

## 2018-06-19 DIAGNOSIS — E119 Type 2 diabetes mellitus without complications: Secondary | ICD-10-CM | POA: Diagnosis not present

## 2018-06-19 DIAGNOSIS — I10 Essential (primary) hypertension: Secondary | ICD-10-CM | POA: Diagnosis not present

## 2018-06-25 DIAGNOSIS — R791 Abnormal coagulation profile: Secondary | ICD-10-CM | POA: Diagnosis not present

## 2018-06-27 DIAGNOSIS — D510 Vitamin B12 deficiency anemia due to intrinsic factor deficiency: Secondary | ICD-10-CM | POA: Diagnosis not present

## 2018-07-02 DIAGNOSIS — R791 Abnormal coagulation profile: Secondary | ICD-10-CM | POA: Diagnosis not present

## 2018-07-16 DIAGNOSIS — R791 Abnormal coagulation profile: Secondary | ICD-10-CM | POA: Diagnosis not present

## 2018-07-17 DIAGNOSIS — R5383 Other fatigue: Secondary | ICD-10-CM | POA: Diagnosis not present

## 2018-07-22 DIAGNOSIS — R791 Abnormal coagulation profile: Secondary | ICD-10-CM | POA: Diagnosis not present

## 2018-07-29 DIAGNOSIS — D510 Vitamin B12 deficiency anemia due to intrinsic factor deficiency: Secondary | ICD-10-CM | POA: Diagnosis not present

## 2018-07-29 DIAGNOSIS — R791 Abnormal coagulation profile: Secondary | ICD-10-CM | POA: Diagnosis not present

## 2018-08-09 DIAGNOSIS — R791 Abnormal coagulation profile: Secondary | ICD-10-CM | POA: Diagnosis not present

## 2018-08-13 DIAGNOSIS — D591 Other autoimmune hemolytic anemias: Secondary | ICD-10-CM | POA: Diagnosis not present

## 2018-08-13 DIAGNOSIS — R42 Dizziness and giddiness: Secondary | ICD-10-CM | POA: Diagnosis not present

## 2018-08-13 DIAGNOSIS — H6692 Otitis media, unspecified, left ear: Secondary | ICD-10-CM | POA: Diagnosis not present

## 2018-08-13 DIAGNOSIS — R112 Nausea with vomiting, unspecified: Secondary | ICD-10-CM | POA: Diagnosis not present

## 2018-08-13 DIAGNOSIS — Z23 Encounter for immunization: Secondary | ICD-10-CM | POA: Diagnosis not present

## 2018-08-16 DIAGNOSIS — R791 Abnormal coagulation profile: Secondary | ICD-10-CM | POA: Diagnosis not present

## 2018-08-22 DIAGNOSIS — R791 Abnormal coagulation profile: Secondary | ICD-10-CM | POA: Diagnosis not present

## 2018-08-29 DIAGNOSIS — R791 Abnormal coagulation profile: Secondary | ICD-10-CM | POA: Diagnosis not present

## 2018-09-03 DIAGNOSIS — R531 Weakness: Secondary | ICD-10-CM | POA: Diagnosis not present

## 2018-09-03 DIAGNOSIS — N390 Urinary tract infection, site not specified: Secondary | ICD-10-CM | POA: Diagnosis not present

## 2018-09-03 DIAGNOSIS — H9202 Otalgia, left ear: Secondary | ICD-10-CM | POA: Diagnosis not present

## 2018-09-03 DIAGNOSIS — I959 Hypotension, unspecified: Secondary | ICD-10-CM | POA: Diagnosis not present

## 2018-09-05 DIAGNOSIS — R791 Abnormal coagulation profile: Secondary | ICD-10-CM | POA: Diagnosis not present

## 2018-09-09 DIAGNOSIS — I129 Hypertensive chronic kidney disease with stage 1 through stage 4 chronic kidney disease, or unspecified chronic kidney disease: Secondary | ICD-10-CM | POA: Diagnosis not present

## 2018-09-11 DIAGNOSIS — I10 Essential (primary) hypertension: Secondary | ICD-10-CM | POA: Diagnosis not present

## 2018-09-11 DIAGNOSIS — E119 Type 2 diabetes mellitus without complications: Secondary | ICD-10-CM | POA: Diagnosis not present

## 2018-09-11 DIAGNOSIS — N289 Disorder of kidney and ureter, unspecified: Secondary | ICD-10-CM | POA: Diagnosis not present

## 2018-09-11 DIAGNOSIS — R5383 Other fatigue: Secondary | ICD-10-CM | POA: Diagnosis not present

## 2018-09-11 DIAGNOSIS — R791 Abnormal coagulation profile: Secondary | ICD-10-CM | POA: Diagnosis not present

## 2018-09-20 DIAGNOSIS — N183 Chronic kidney disease, stage 3 (moderate): Secondary | ICD-10-CM | POA: Diagnosis not present

## 2018-09-20 DIAGNOSIS — I10 Essential (primary) hypertension: Secondary | ICD-10-CM | POA: Diagnosis not present

## 2018-09-20 DIAGNOSIS — I129 Hypertensive chronic kidney disease with stage 1 through stage 4 chronic kidney disease, or unspecified chronic kidney disease: Secondary | ICD-10-CM | POA: Diagnosis not present

## 2018-09-23 DIAGNOSIS — E785 Hyperlipidemia, unspecified: Secondary | ICD-10-CM | POA: Diagnosis not present

## 2018-09-23 DIAGNOSIS — I10 Essential (primary) hypertension: Secondary | ICD-10-CM | POA: Diagnosis not present

## 2018-09-23 DIAGNOSIS — K219 Gastro-esophageal reflux disease without esophagitis: Secondary | ICD-10-CM | POA: Diagnosis not present

## 2018-09-23 DIAGNOSIS — R791 Abnormal coagulation profile: Secondary | ICD-10-CM | POA: Diagnosis not present

## 2018-09-23 DIAGNOSIS — N390 Urinary tract infection, site not specified: Secondary | ICD-10-CM | POA: Diagnosis not present

## 2018-09-23 DIAGNOSIS — E119 Type 2 diabetes mellitus without complications: Secondary | ICD-10-CM | POA: Diagnosis not present

## 2018-09-23 DIAGNOSIS — E559 Vitamin D deficiency, unspecified: Secondary | ICD-10-CM | POA: Diagnosis not present

## 2018-10-01 DIAGNOSIS — D510 Vitamin B12 deficiency anemia due to intrinsic factor deficiency: Secondary | ICD-10-CM | POA: Diagnosis not present

## 2018-10-01 DIAGNOSIS — R791 Abnormal coagulation profile: Secondary | ICD-10-CM | POA: Diagnosis not present

## 2018-10-02 DIAGNOSIS — E119 Type 2 diabetes mellitus without complications: Secondary | ICD-10-CM | POA: Diagnosis not present

## 2018-10-04 DIAGNOSIS — R791 Abnormal coagulation profile: Secondary | ICD-10-CM | POA: Diagnosis not present

## 2018-10-07 DIAGNOSIS — H26491 Other secondary cataract, right eye: Secondary | ICD-10-CM | POA: Diagnosis not present

## 2018-10-07 DIAGNOSIS — H35372 Puckering of macula, left eye: Secondary | ICD-10-CM | POA: Diagnosis not present

## 2018-10-11 DIAGNOSIS — R791 Abnormal coagulation profile: Secondary | ICD-10-CM | POA: Diagnosis not present

## 2018-10-15 ENCOUNTER — Other Ambulatory Visit: Payer: Self-pay

## 2018-10-15 NOTE — Patient Outreach (Signed)
[facility]) Care Management  10/15/2018  Stacie Rodriguez 02-26-1945 [id number]  Medication Adherence call to Stacie Rodriguez Hippa Identifiers Verify spoke with patient she is past due on Lisinopril 10 mg patient explain she is only taking 1/2 tablet daily patient has medication at this time per doctor new instructions [name] is showing past due under [place].   [place] Management Direct Dial [phone]  Fax [phone] [email or web address]

## 2018-10-17 DIAGNOSIS — Z23 Encounter for immunization: Secondary | ICD-10-CM | POA: Diagnosis not present

## 2018-10-17 DIAGNOSIS — R791 Abnormal coagulation profile: Secondary | ICD-10-CM | POA: Diagnosis not present

## 2018-10-24 DIAGNOSIS — R791 Abnormal coagulation profile: Secondary | ICD-10-CM | POA: Diagnosis not present

## 2018-10-31 DIAGNOSIS — R791 Abnormal coagulation profile: Secondary | ICD-10-CM | POA: Diagnosis not present

## 2018-11-07 DIAGNOSIS — R791 Abnormal coagulation profile: Secondary | ICD-10-CM | POA: Diagnosis not present

## 2018-11-21 DIAGNOSIS — R791 Abnormal coagulation profile: Secondary | ICD-10-CM | POA: Diagnosis not present

## 2018-11-21 DIAGNOSIS — D510 Vitamin B12 deficiency anemia due to intrinsic factor deficiency: Secondary | ICD-10-CM | POA: Diagnosis not present

## 2018-11-28 DIAGNOSIS — M7741 Metatarsalgia, right foot: Secondary | ICD-10-CM | POA: Diagnosis not present

## 2018-12-05 DIAGNOSIS — R791 Abnormal coagulation profile: Secondary | ICD-10-CM | POA: Diagnosis not present

## 2018-12-05 DIAGNOSIS — Z1211 Encounter for screening for malignant neoplasm of colon: Secondary | ICD-10-CM | POA: Diagnosis not present

## 2018-12-05 DIAGNOSIS — E119 Type 2 diabetes mellitus without complications: Secondary | ICD-10-CM | POA: Diagnosis not present

## 2018-12-12 DIAGNOSIS — R791 Abnormal coagulation profile: Secondary | ICD-10-CM | POA: Diagnosis not present

## 2018-12-16 DIAGNOSIS — Z1211 Encounter for screening for malignant neoplasm of colon: Secondary | ICD-10-CM | POA: Diagnosis not present

## 2018-12-16 DIAGNOSIS — Z1212 Encounter for screening for malignant neoplasm of rectum: Secondary | ICD-10-CM | POA: Diagnosis not present

## 2018-12-20 DIAGNOSIS — R791 Abnormal coagulation profile: Secondary | ICD-10-CM | POA: Diagnosis not present

## 2018-12-25 DIAGNOSIS — E119 Type 2 diabetes mellitus without complications: Secondary | ICD-10-CM | POA: Diagnosis not present

## 2018-12-25 DIAGNOSIS — E559 Vitamin D deficiency, unspecified: Secondary | ICD-10-CM | POA: Diagnosis not present

## 2018-12-25 DIAGNOSIS — D510 Vitamin B12 deficiency anemia due to intrinsic factor deficiency: Secondary | ICD-10-CM | POA: Diagnosis not present

## 2018-12-25 DIAGNOSIS — I10 Essential (primary) hypertension: Secondary | ICD-10-CM | POA: Diagnosis not present

## 2018-12-25 DIAGNOSIS — E785 Hyperlipidemia, unspecified: Secondary | ICD-10-CM | POA: Diagnosis not present

## 2018-12-25 DIAGNOSIS — R791 Abnormal coagulation profile: Secondary | ICD-10-CM | POA: Diagnosis not present

## 2018-12-30 ENCOUNTER — Other Ambulatory Visit: Payer: Self-pay

## 2018-12-30 NOTE — Patient Outreach (Signed)
[facility]) Care Management  12/30/2018  Stacie Rodriguez [birth year] [id number]   Medication Adherence call to Stacie Rodriguez Hippa Identifiers Verify spoke with patient she is past due on Lisinopril 10 mg ,patient explain she is only taking 1/2 tablet daily,and has plenty at this time,Stacie Rodriguez is showing past due under [place].   [place] Management Direct Dial [phone]  Fax [phone] [email or web address]

## 2018-12-31 DIAGNOSIS — Z9181 History of falling: Secondary | ICD-10-CM | POA: Diagnosis not present

## 2018-12-31 DIAGNOSIS — Z0000 Encounter for general adult medical examination without abnormal findings: Secondary | ICD-10-CM | POA: Diagnosis not present

## 2018-12-31 DIAGNOSIS — Z1231 Encounter for screening mammogram for malignant neoplasm of breast: Secondary | ICD-10-CM | POA: Diagnosis not present

## 2018-12-31 DIAGNOSIS — E785 Hyperlipidemia, unspecified: Secondary | ICD-10-CM | POA: Diagnosis not present

## 2018-12-31 DIAGNOSIS — Z139 Encounter for screening, unspecified: Secondary | ICD-10-CM | POA: Diagnosis not present

## 2018-12-31 DIAGNOSIS — N959 Unspecified menopausal and perimenopausal disorder: Secondary | ICD-10-CM | POA: Diagnosis not present

## 2019-01-02 DIAGNOSIS — R791 Abnormal coagulation profile: Secondary | ICD-10-CM | POA: Diagnosis not present

## 2019-01-15 DIAGNOSIS — R791 Abnormal coagulation profile: Secondary | ICD-10-CM | POA: Diagnosis not present

## 2019-01-21 DIAGNOSIS — R791 Abnormal coagulation profile: Secondary | ICD-10-CM | POA: Diagnosis not present

## 2019-01-28 DIAGNOSIS — D510 Vitamin B12 deficiency anemia due to intrinsic factor deficiency: Secondary | ICD-10-CM | POA: Diagnosis not present

## 2019-01-28 DIAGNOSIS — R791 Abnormal coagulation profile: Secondary | ICD-10-CM | POA: Diagnosis not present

## 2019-02-04 DIAGNOSIS — R791 Abnormal coagulation profile: Secondary | ICD-10-CM | POA: Diagnosis not present

## 2019-02-07 DIAGNOSIS — Z1231 Encounter for screening mammogram for malignant neoplasm of breast: Secondary | ICD-10-CM | POA: Diagnosis not present

## 2019-02-07 DIAGNOSIS — L989 Disorder of the skin and subcutaneous tissue, unspecified: Secondary | ICD-10-CM | POA: Diagnosis not present

## 2019-02-07 DIAGNOSIS — H6691 Otitis media, unspecified, right ear: Secondary | ICD-10-CM | POA: Diagnosis not present

## 2019-02-18 DIAGNOSIS — R791 Abnormal coagulation profile: Secondary | ICD-10-CM | POA: Diagnosis not present

## 2019-02-25 DIAGNOSIS — D510 Vitamin B12 deficiency anemia due to intrinsic factor deficiency: Secondary | ICD-10-CM | POA: Diagnosis not present

## 2019-02-25 DIAGNOSIS — R791 Abnormal coagulation profile: Secondary | ICD-10-CM | POA: Diagnosis not present

## 2019-03-04 DIAGNOSIS — Z20822 Contact with and (suspected) exposure to covid-19: Secondary | ICD-10-CM | POA: Diagnosis not present

## 2019-03-04 DIAGNOSIS — R6889 Other general symptoms and signs: Secondary | ICD-10-CM | POA: Diagnosis not present

## 2019-03-04 DIAGNOSIS — H6691 Otitis media, unspecified, right ear: Secondary | ICD-10-CM | POA: Diagnosis not present

## 2019-03-04 DIAGNOSIS — R791 Abnormal coagulation profile: Secondary | ICD-10-CM | POA: Diagnosis not present

## 2019-03-06 ENCOUNTER — Other Ambulatory Visit: Payer: Self-pay

## 2019-03-06 NOTE — Patient Outreach (Signed)
[facility]) Care Management  03/06/2019  Stacie Rodriguez 04-04-1945 [id number]   Medication Adherence call to Stacie Rodriguez Hippa Identifiers Verify spoke with patient she is showing past due on Jardiance 10 mg,patient explain she is receiving samples from doctors office and has medication at this time,patient was a bit upset because we keep calling for the the same medication. [name] is showing past due under [place].   [place] Management Direct Dial [phone]  Fax [phone] [email or web address]

## 2019-03-11 DIAGNOSIS — R791 Abnormal coagulation profile: Secondary | ICD-10-CM | POA: Diagnosis not present

## 2019-03-19 DIAGNOSIS — R791 Abnormal coagulation profile: Secondary | ICD-10-CM | POA: Diagnosis not present

## 2019-03-19 DIAGNOSIS — H6092 Unspecified otitis externa, left ear: Secondary | ICD-10-CM | POA: Diagnosis not present

## 2019-04-01 DIAGNOSIS — E119 Type 2 diabetes mellitus without complications: Secondary | ICD-10-CM | POA: Diagnosis not present

## 2019-04-01 DIAGNOSIS — E785 Hyperlipidemia, unspecified: Secondary | ICD-10-CM | POA: Diagnosis not present

## 2019-04-01 DIAGNOSIS — R791 Abnormal coagulation profile: Secondary | ICD-10-CM | POA: Diagnosis not present

## 2019-04-01 DIAGNOSIS — I10 Essential (primary) hypertension: Secondary | ICD-10-CM | POA: Diagnosis not present

## 2019-04-01 DIAGNOSIS — D510 Vitamin B12 deficiency anemia due to intrinsic factor deficiency: Secondary | ICD-10-CM | POA: Diagnosis not present

## 2019-04-09 DIAGNOSIS — M810 Age-related osteoporosis without current pathological fracture: Secondary | ICD-10-CM | POA: Diagnosis not present

## 2019-04-09 DIAGNOSIS — M8589 Other specified disorders of bone density and structure, multiple sites: Secondary | ICD-10-CM | POA: Diagnosis not present

## 2019-04-15 DIAGNOSIS — I129 Hypertensive chronic kidney disease with stage 1 through stage 4 chronic kidney disease, or unspecified chronic kidney disease: Secondary | ICD-10-CM | POA: Diagnosis not present

## 2019-04-15 DIAGNOSIS — M810 Age-related osteoporosis without current pathological fracture: Secondary | ICD-10-CM | POA: Diagnosis not present

## 2019-04-15 DIAGNOSIS — R791 Abnormal coagulation profile: Secondary | ICD-10-CM | POA: Diagnosis not present

## 2019-04-18 DIAGNOSIS — J302 Other seasonal allergic rhinitis: Secondary | ICD-10-CM | POA: Diagnosis not present

## 2019-04-22 DIAGNOSIS — R791 Abnormal coagulation profile: Secondary | ICD-10-CM | POA: Diagnosis not present

## 2019-04-29 DIAGNOSIS — R791 Abnormal coagulation profile: Secondary | ICD-10-CM | POA: Diagnosis not present

## 2019-05-02 DIAGNOSIS — D510 Vitamin B12 deficiency anemia due to intrinsic factor deficiency: Secondary | ICD-10-CM | POA: Diagnosis not present

## 2019-05-06 DIAGNOSIS — R791 Abnormal coagulation profile: Secondary | ICD-10-CM | POA: Diagnosis not present

## 2019-05-13 DIAGNOSIS — R791 Abnormal coagulation profile: Secondary | ICD-10-CM | POA: Diagnosis not present

## 2019-05-27 DIAGNOSIS — R791 Abnormal coagulation profile: Secondary | ICD-10-CM | POA: Diagnosis not present

## 2019-05-29 DIAGNOSIS — J309 Allergic rhinitis, unspecified: Secondary | ICD-10-CM | POA: Diagnosis not present

## 2019-05-29 DIAGNOSIS — R600 Localized edema: Secondary | ICD-10-CM | POA: Diagnosis not present

## 2019-06-03 DIAGNOSIS — I129 Hypertensive chronic kidney disease with stage 1 through stage 4 chronic kidney disease, or unspecified chronic kidney disease: Secondary | ICD-10-CM | POA: Diagnosis not present

## 2019-06-03 DIAGNOSIS — N183 Chronic kidney disease, stage 3 unspecified: Secondary | ICD-10-CM | POA: Diagnosis not present

## 2019-06-03 DIAGNOSIS — R791 Abnormal coagulation profile: Secondary | ICD-10-CM | POA: Diagnosis not present

## 2019-06-03 DIAGNOSIS — E538 Deficiency of other specified B group vitamins: Secondary | ICD-10-CM | POA: Diagnosis not present

## 2019-06-17 DIAGNOSIS — I129 Hypertensive chronic kidney disease with stage 1 through stage 4 chronic kidney disease, or unspecified chronic kidney disease: Secondary | ICD-10-CM | POA: Diagnosis not present

## 2019-06-17 DIAGNOSIS — R791 Abnormal coagulation profile: Secondary | ICD-10-CM | POA: Diagnosis not present

## 2019-06-17 DIAGNOSIS — N183 Chronic kidney disease, stage 3 unspecified: Secondary | ICD-10-CM | POA: Diagnosis not present

## 2019-06-25 DIAGNOSIS — M1711 Unilateral primary osteoarthritis, right knee: Secondary | ICD-10-CM | POA: Diagnosis not present

## 2019-07-02 DIAGNOSIS — I129 Hypertensive chronic kidney disease with stage 1 through stage 4 chronic kidney disease, or unspecified chronic kidney disease: Secondary | ICD-10-CM | POA: Diagnosis not present

## 2019-07-02 DIAGNOSIS — R791 Abnormal coagulation profile: Secondary | ICD-10-CM | POA: Diagnosis not present

## 2019-07-02 DIAGNOSIS — E785 Hyperlipidemia, unspecified: Secondary | ICD-10-CM | POA: Diagnosis not present

## 2019-07-02 DIAGNOSIS — E119 Type 2 diabetes mellitus without complications: Secondary | ICD-10-CM | POA: Diagnosis not present

## 2019-07-02 DIAGNOSIS — E559 Vitamin D deficiency, unspecified: Secondary | ICD-10-CM | POA: Diagnosis not present

## 2019-07-08 DIAGNOSIS — D510 Vitamin B12 deficiency anemia due to intrinsic factor deficiency: Secondary | ICD-10-CM | POA: Diagnosis not present

## 2019-07-08 DIAGNOSIS — R791 Abnormal coagulation profile: Secondary | ICD-10-CM | POA: Diagnosis not present

## 2019-07-16 DIAGNOSIS — I129 Hypertensive chronic kidney disease with stage 1 through stage 4 chronic kidney disease, or unspecified chronic kidney disease: Secondary | ICD-10-CM | POA: Diagnosis not present

## 2019-07-22 DIAGNOSIS — R5383 Other fatigue: Secondary | ICD-10-CM | POA: Diagnosis not present

## 2019-07-22 DIAGNOSIS — R791 Abnormal coagulation profile: Secondary | ICD-10-CM | POA: Diagnosis not present

## 2019-08-05 DIAGNOSIS — R791 Abnormal coagulation profile: Secondary | ICD-10-CM | POA: Diagnosis not present

## 2019-08-14 DIAGNOSIS — D510 Vitamin B12 deficiency anemia due to intrinsic factor deficiency: Secondary | ICD-10-CM | POA: Diagnosis not present

## 2019-08-14 DIAGNOSIS — R791 Abnormal coagulation profile: Secondary | ICD-10-CM | POA: Diagnosis not present

## 2019-08-21 DIAGNOSIS — R791 Abnormal coagulation profile: Secondary | ICD-10-CM | POA: Diagnosis not present

## 2019-09-04 DIAGNOSIS — R791 Abnormal coagulation profile: Secondary | ICD-10-CM | POA: Diagnosis not present

## 2019-09-15 DIAGNOSIS — R791 Abnormal coagulation profile: Secondary | ICD-10-CM | POA: Diagnosis not present

## 2019-09-15 DIAGNOSIS — D510 Vitamin B12 deficiency anemia due to intrinsic factor deficiency: Secondary | ICD-10-CM | POA: Diagnosis not present

## 2019-09-15 DIAGNOSIS — R110 Nausea: Secondary | ICD-10-CM | POA: Diagnosis not present

## 2019-09-15 DIAGNOSIS — R519 Headache, unspecified: Secondary | ICD-10-CM | POA: Diagnosis not present

## 2019-09-18 DIAGNOSIS — H35363 Drusen (degenerative) of macula, bilateral: Secondary | ICD-10-CM | POA: Diagnosis not present

## 2019-09-22 DIAGNOSIS — E876 Hypokalemia: Secondary | ICD-10-CM | POA: Diagnosis not present

## 2019-09-22 DIAGNOSIS — R791 Abnormal coagulation profile: Secondary | ICD-10-CM | POA: Diagnosis not present

## 2019-09-26 DIAGNOSIS — J40 Bronchitis, not specified as acute or chronic: Secondary | ICD-10-CM | POA: Diagnosis not present

## 2019-09-26 DIAGNOSIS — J329 Chronic sinusitis, unspecified: Secondary | ICD-10-CM | POA: Diagnosis not present

## 2019-09-26 DIAGNOSIS — R531 Weakness: Secondary | ICD-10-CM | POA: Diagnosis not present

## 2019-09-26 DIAGNOSIS — I129 Hypertensive chronic kidney disease with stage 1 through stage 4 chronic kidney disease, or unspecified chronic kidney disease: Secondary | ICD-10-CM | POA: Diagnosis not present

## 2019-09-26 DIAGNOSIS — N183 Chronic kidney disease, stage 3 unspecified: Secondary | ICD-10-CM | POA: Diagnosis not present

## 2019-10-02 DIAGNOSIS — Z7901 Long term (current) use of anticoagulants: Secondary | ICD-10-CM | POA: Diagnosis not present

## 2019-10-02 DIAGNOSIS — I129 Hypertensive chronic kidney disease with stage 1 through stage 4 chronic kidney disease, or unspecified chronic kidney disease: Secondary | ICD-10-CM | POA: Diagnosis not present

## 2019-10-02 DIAGNOSIS — E119 Type 2 diabetes mellitus without complications: Secondary | ICD-10-CM | POA: Diagnosis not present

## 2019-10-02 DIAGNOSIS — E559 Vitamin D deficiency, unspecified: Secondary | ICD-10-CM | POA: Diagnosis not present

## 2019-10-02 DIAGNOSIS — Z9229 Personal history of other drug therapy: Secondary | ICD-10-CM | POA: Diagnosis not present

## 2019-10-02 DIAGNOSIS — E785 Hyperlipidemia, unspecified: Secondary | ICD-10-CM | POA: Diagnosis not present

## 2019-10-02 DIAGNOSIS — R5383 Other fatigue: Secondary | ICD-10-CM | POA: Diagnosis not present

## 2019-10-07 DIAGNOSIS — R42 Dizziness and giddiness: Secondary | ICD-10-CM | POA: Diagnosis not present

## 2019-10-07 DIAGNOSIS — S0990XA Unspecified injury of head, initial encounter: Secondary | ICD-10-CM | POA: Diagnosis not present

## 2019-10-07 DIAGNOSIS — I739 Peripheral vascular disease, unspecified: Secondary | ICD-10-CM | POA: Diagnosis not present

## 2019-10-07 DIAGNOSIS — G319 Degenerative disease of nervous system, unspecified: Secondary | ICD-10-CM | POA: Diagnosis not present

## 2019-10-14 DIAGNOSIS — R0902 Hypoxemia: Secondary | ICD-10-CM | POA: Diagnosis not present

## 2019-10-14 DIAGNOSIS — S0990XA Unspecified injury of head, initial encounter: Secondary | ICD-10-CM | POA: Diagnosis not present

## 2019-10-14 DIAGNOSIS — R52 Pain, unspecified: Secondary | ICD-10-CM | POA: Diagnosis not present

## 2019-10-14 DIAGNOSIS — M4312 Spondylolisthesis, cervical region: Secondary | ICD-10-CM | POA: Diagnosis not present

## 2019-10-14 DIAGNOSIS — S199XXA Unspecified injury of neck, initial encounter: Secondary | ICD-10-CM | POA: Diagnosis not present

## 2019-10-14 DIAGNOSIS — M47812 Spondylosis without myelopathy or radiculopathy, cervical region: Secondary | ICD-10-CM | POA: Diagnosis not present

## 2019-10-14 DIAGNOSIS — W19XXXA Unspecified fall, initial encounter: Secondary | ICD-10-CM | POA: Diagnosis not present

## 2019-10-14 DIAGNOSIS — I7090 Unspecified atherosclerosis: Secondary | ICD-10-CM | POA: Diagnosis not present

## 2019-10-14 DIAGNOSIS — S0003XA Contusion of scalp, initial encounter: Secondary | ICD-10-CM | POA: Diagnosis not present

## 2019-10-14 DIAGNOSIS — I10 Essential (primary) hypertension: Secondary | ICD-10-CM | POA: Diagnosis not present

## 2019-10-15 DIAGNOSIS — S0990XA Unspecified injury of head, initial encounter: Secondary | ICD-10-CM | POA: Diagnosis not present

## 2019-10-15 DIAGNOSIS — R791 Abnormal coagulation profile: Secondary | ICD-10-CM | POA: Diagnosis not present

## 2019-10-15 DIAGNOSIS — W19XXXA Unspecified fall, initial encounter: Secondary | ICD-10-CM | POA: Diagnosis not present

## 2019-10-29 DIAGNOSIS — R791 Abnormal coagulation profile: Secondary | ICD-10-CM | POA: Diagnosis not present

## 2019-10-29 DIAGNOSIS — Z23 Encounter for immunization: Secondary | ICD-10-CM | POA: Diagnosis not present

## 2019-11-12 DIAGNOSIS — D510 Vitamin B12 deficiency anemia due to intrinsic factor deficiency: Secondary | ICD-10-CM | POA: Diagnosis not present

## 2019-11-12 DIAGNOSIS — R791 Abnormal coagulation profile: Secondary | ICD-10-CM | POA: Diagnosis not present

## 2019-11-24 DIAGNOSIS — R791 Abnormal coagulation profile: Secondary | ICD-10-CM | POA: Diagnosis not present

## 2019-12-08 DIAGNOSIS — R791 Abnormal coagulation profile: Secondary | ICD-10-CM | POA: Diagnosis not present

## 2019-12-08 DIAGNOSIS — B372 Candidiasis of skin and nail: Secondary | ICD-10-CM | POA: Diagnosis not present

## 2019-12-15 DIAGNOSIS — R791 Abnormal coagulation profile: Secondary | ICD-10-CM | POA: Diagnosis not present

## 2019-12-22 DIAGNOSIS — R791 Abnormal coagulation profile: Secondary | ICD-10-CM | POA: Diagnosis not present

## 2019-12-22 DIAGNOSIS — E538 Deficiency of other specified B group vitamins: Secondary | ICD-10-CM | POA: Diagnosis not present

## 2019-12-26 DIAGNOSIS — R5383 Other fatigue: Secondary | ICD-10-CM | POA: Diagnosis not present

## 2019-12-26 DIAGNOSIS — Z7409 Other reduced mobility: Secondary | ICD-10-CM | POA: Diagnosis not present

## 2020-01-01 DIAGNOSIS — D510 Vitamin B12 deficiency anemia due to intrinsic factor deficiency: Secondary | ICD-10-CM | POA: Diagnosis not present

## 2020-01-01 DIAGNOSIS — K219 Gastro-esophageal reflux disease without esophagitis: Secondary | ICD-10-CM | POA: Diagnosis not present

## 2020-01-01 DIAGNOSIS — E785 Hyperlipidemia, unspecified: Secondary | ICD-10-CM | POA: Diagnosis not present

## 2020-01-01 DIAGNOSIS — R791 Abnormal coagulation profile: Secondary | ICD-10-CM | POA: Diagnosis not present

## 2020-01-01 DIAGNOSIS — E119 Type 2 diabetes mellitus without complications: Secondary | ICD-10-CM | POA: Diagnosis not present

## 2020-01-01 DIAGNOSIS — I10 Essential (primary) hypertension: Secondary | ICD-10-CM | POA: Diagnosis not present

## 2020-01-01 DIAGNOSIS — E559 Vitamin D deficiency, unspecified: Secondary | ICD-10-CM | POA: Diagnosis not present

## 2020-01-15 DIAGNOSIS — Z7901 Long term (current) use of anticoagulants: Secondary | ICD-10-CM | POA: Diagnosis not present

## 2020-01-22 DIAGNOSIS — B349 Viral infection, unspecified: Secondary | ICD-10-CM | POA: Diagnosis not present

## 2020-01-22 DIAGNOSIS — J020 Streptococcal pharyngitis: Secondary | ICD-10-CM | POA: Diagnosis not present

## 2020-01-22 DIAGNOSIS — J029 Acute pharyngitis, unspecified: Secondary | ICD-10-CM | POA: Diagnosis not present

## 2020-02-10 DIAGNOSIS — Z9842 Cataract extraction status, left eye: Secondary | ICD-10-CM | POA: Diagnosis not present

## 2020-02-10 DIAGNOSIS — H524 Presbyopia: Secondary | ICD-10-CM | POA: Diagnosis not present

## 2020-02-10 DIAGNOSIS — H35372 Puckering of macula, left eye: Secondary | ICD-10-CM | POA: Diagnosis not present

## 2020-02-10 DIAGNOSIS — H5203 Hypermetropia, bilateral: Secondary | ICD-10-CM | POA: Diagnosis not present

## 2020-02-10 DIAGNOSIS — H26491 Other secondary cataract, right eye: Secondary | ICD-10-CM | POA: Diagnosis not present

## 2020-02-10 DIAGNOSIS — Z961 Presence of intraocular lens: Secondary | ICD-10-CM | POA: Diagnosis not present

## 2020-02-16 DIAGNOSIS — E538 Deficiency of other specified B group vitamins: Secondary | ICD-10-CM | POA: Diagnosis not present

## 2020-02-16 DIAGNOSIS — Z7901 Long term (current) use of anticoagulants: Secondary | ICD-10-CM | POA: Diagnosis not present

## 2020-02-16 DIAGNOSIS — Z86711 Personal history of pulmonary embolism: Secondary | ICD-10-CM | POA: Diagnosis not present

## 2020-02-27 DIAGNOSIS — M26621 Arthralgia of right temporomandibular joint: Secondary | ICD-10-CM | POA: Diagnosis not present

## 2020-02-27 DIAGNOSIS — H9201 Otalgia, right ear: Secondary | ICD-10-CM | POA: Diagnosis not present

## 2020-02-27 DIAGNOSIS — J342 Deviated nasal septum: Secondary | ICD-10-CM | POA: Diagnosis not present

## 2020-03-08 DIAGNOSIS — Z9181 History of falling: Secondary | ICD-10-CM | POA: Diagnosis not present

## 2020-03-08 DIAGNOSIS — E785 Hyperlipidemia, unspecified: Secondary | ICD-10-CM | POA: Diagnosis not present

## 2020-03-08 DIAGNOSIS — Z139 Encounter for screening, unspecified: Secondary | ICD-10-CM | POA: Diagnosis not present

## 2020-03-08 DIAGNOSIS — Z0000 Encounter for general adult medical examination without abnormal findings: Secondary | ICD-10-CM | POA: Diagnosis not present

## 2020-03-09 DIAGNOSIS — H26493 Other secondary cataract, bilateral: Secondary | ICD-10-CM | POA: Diagnosis not present

## 2020-03-09 DIAGNOSIS — H26491 Other secondary cataract, right eye: Secondary | ICD-10-CM | POA: Diagnosis not present

## 2020-03-18 DIAGNOSIS — E538 Deficiency of other specified B group vitamins: Secondary | ICD-10-CM | POA: Diagnosis not present

## 2020-03-18 DIAGNOSIS — Z86711 Personal history of pulmonary embolism: Secondary | ICD-10-CM | POA: Diagnosis not present

## 2020-03-18 DIAGNOSIS — Z7901 Long term (current) use of anticoagulants: Secondary | ICD-10-CM | POA: Diagnosis not present

## 2020-03-31 DIAGNOSIS — N183 Chronic kidney disease, stage 3 unspecified: Secondary | ICD-10-CM | POA: Diagnosis not present

## 2020-03-31 DIAGNOSIS — M810 Age-related osteoporosis without current pathological fracture: Secondary | ICD-10-CM | POA: Diagnosis not present

## 2020-03-31 DIAGNOSIS — E538 Deficiency of other specified B group vitamins: Secondary | ICD-10-CM | POA: Diagnosis not present

## 2020-03-31 DIAGNOSIS — I129 Hypertensive chronic kidney disease with stage 1 through stage 4 chronic kidney disease, or unspecified chronic kidney disease: Secondary | ICD-10-CM | POA: Diagnosis not present

## 2020-03-31 DIAGNOSIS — R791 Abnormal coagulation profile: Secondary | ICD-10-CM | POA: Diagnosis not present

## 2020-03-31 DIAGNOSIS — E785 Hyperlipidemia, unspecified: Secondary | ICD-10-CM | POA: Diagnosis not present

## 2020-03-31 DIAGNOSIS — E119 Type 2 diabetes mellitus without complications: Secondary | ICD-10-CM | POA: Diagnosis not present

## 2020-04-07 DIAGNOSIS — N289 Disorder of kidney and ureter, unspecified: Secondary | ICD-10-CM | POA: Diagnosis not present

## 2020-04-07 DIAGNOSIS — Z86711 Personal history of pulmonary embolism: Secondary | ICD-10-CM | POA: Diagnosis not present

## 2020-04-07 DIAGNOSIS — Z7901 Long term (current) use of anticoagulants: Secondary | ICD-10-CM | POA: Diagnosis not present

## 2020-04-21 ENCOUNTER — Other Ambulatory Visit: Payer: Self-pay | Admitting: *Deleted

## 2020-04-21 DIAGNOSIS — Z7901 Long term (current) use of anticoagulants: Secondary | ICD-10-CM | POA: Diagnosis not present

## 2020-04-21 DIAGNOSIS — Z86711 Personal history of pulmonary embolism: Secondary | ICD-10-CM | POA: Diagnosis not present

## 2020-04-21 DIAGNOSIS — W19XXXA Unspecified fall, initial encounter: Secondary | ICD-10-CM | POA: Diagnosis not present

## 2020-04-21 NOTE — Patient Outreach (Signed)
[facility]) Care Management  04/21/2020  VAUGHN FRIEZE 06/22/1945 [id number]   Referral received 3/22 Initial Outreach 3/30-Unsuccessful  RN attempted outreach call however unsuccessful. RN able to leave a HIPAA approved voice message requesting a call back. Will further engage at that time.  Will rescheduled another outreach call over the next week for pending [facility] services and send outreach letter.   Raina Mina, RN Care Management Coordinator [place] Office [phone]

## 2020-04-27 ENCOUNTER — Ambulatory Visit: Payer: Self-pay | Admitting: *Deleted

## 2020-05-03 ENCOUNTER — Other Ambulatory Visit: Payer: Self-pay | Admitting: *Deleted

## 2020-05-03 NOTE — Patient Outreach (Signed)
[facility]) Care Management  05/03/2020  Stacie Rodriguez 1945-07-04 [id number]   Telephone Screen-Successful-Declined.  RN spoke with pt today and explained the purpose for today's call. Pt explains her medical history and declined any needs at this time. States her provider is Camelia Phenes PA-C. Pt has a supportive spouse in the home with no children. Pt has HTN controlled and DM which she does not wish to check her diabetes with her A1C in the mid 6 read. Pt has sufficient transportation to all her medical appointments and uses her cane or 4 wheel rolling walker on her outings.  Discussed in detail services available via [facility] for social worker, pharmacy and health coaches for quarterly disease management but pt continue to declined [facility]. RN offered to send [facility] packet with information concerning available services (receptive). Pt wishes to contact [facility] if she would like services at a later time and date.   Will close case at this time with no PCP listed.  Raina Mina, RN Care Management Coordinator [place] Office [phone]

## 2020-05-25 DIAGNOSIS — E538 Deficiency of other specified B group vitamins: Secondary | ICD-10-CM | POA: Diagnosis not present

## 2020-05-25 DIAGNOSIS — L820 Inflamed seborrheic keratosis: Secondary | ICD-10-CM | POA: Diagnosis not present

## 2020-05-25 DIAGNOSIS — R5383 Other fatigue: Secondary | ICD-10-CM | POA: Diagnosis not present

## 2020-05-25 DIAGNOSIS — L570 Actinic keratosis: Secondary | ICD-10-CM | POA: Diagnosis not present

## 2020-05-25 DIAGNOSIS — L578 Other skin changes due to chronic exposure to nonionizing radiation: Secondary | ICD-10-CM | POA: Diagnosis not present

## 2020-05-25 DIAGNOSIS — J209 Acute bronchitis, unspecified: Secondary | ICD-10-CM | POA: Diagnosis not present

## 2020-05-25 DIAGNOSIS — E559 Vitamin D deficiency, unspecified: Secondary | ICD-10-CM | POA: Diagnosis not present

## 2020-05-25 DIAGNOSIS — Z7901 Long term (current) use of anticoagulants: Secondary | ICD-10-CM | POA: Diagnosis not present

## 2020-05-25 DIAGNOSIS — Z86711 Personal history of pulmonary embolism: Secondary | ICD-10-CM | POA: Diagnosis not present

## 2020-06-24 DIAGNOSIS — R791 Abnormal coagulation profile: Secondary | ICD-10-CM | POA: Diagnosis not present

## 2020-06-24 DIAGNOSIS — Z86711 Personal history of pulmonary embolism: Secondary | ICD-10-CM | POA: Diagnosis not present

## 2020-07-05 DIAGNOSIS — E119 Type 2 diabetes mellitus without complications: Secondary | ICD-10-CM | POA: Diagnosis not present

## 2020-07-05 DIAGNOSIS — R791 Abnormal coagulation profile: Secondary | ICD-10-CM | POA: Diagnosis not present

## 2020-07-05 DIAGNOSIS — E559 Vitamin D deficiency, unspecified: Secondary | ICD-10-CM | POA: Diagnosis not present

## 2020-07-05 DIAGNOSIS — E538 Deficiency of other specified B group vitamins: Secondary | ICD-10-CM | POA: Diagnosis not present

## 2020-07-05 DIAGNOSIS — Z79899 Other long term (current) drug therapy: Secondary | ICD-10-CM | POA: Diagnosis not present

## 2020-07-05 DIAGNOSIS — F32A Depression, unspecified: Secondary | ICD-10-CM | POA: Diagnosis not present

## 2020-07-05 DIAGNOSIS — I10 Essential (primary) hypertension: Secondary | ICD-10-CM | POA: Diagnosis not present

## 2020-07-12 DIAGNOSIS — S63055A Dislocation of other carpometacarpal joint of left hand, initial encounter: Secondary | ICD-10-CM | POA: Diagnosis not present

## 2020-07-19 DIAGNOSIS — Z86711 Personal history of pulmonary embolism: Secondary | ICD-10-CM | POA: Diagnosis not present

## 2020-07-19 DIAGNOSIS — Z7901 Long term (current) use of anticoagulants: Secondary | ICD-10-CM | POA: Diagnosis not present

## 2020-08-19 DIAGNOSIS — Z86711 Personal history of pulmonary embolism: Secondary | ICD-10-CM | POA: Diagnosis not present

## 2020-08-19 DIAGNOSIS — Z7901 Long term (current) use of anticoagulants: Secondary | ICD-10-CM | POA: Diagnosis not present

## 2020-08-31 DIAGNOSIS — L578 Other skin changes due to chronic exposure to nonionizing radiation: Secondary | ICD-10-CM | POA: Diagnosis not present

## 2020-08-31 DIAGNOSIS — L820 Inflamed seborrheic keratosis: Secondary | ICD-10-CM | POA: Diagnosis not present

## 2020-08-31 DIAGNOSIS — D225 Melanocytic nevi of trunk: Secondary | ICD-10-CM | POA: Diagnosis not present

## 2020-08-31 DIAGNOSIS — L570 Actinic keratosis: Secondary | ICD-10-CM | POA: Diagnosis not present

## 2020-08-31 DIAGNOSIS — L821 Other seborrheic keratosis: Secondary | ICD-10-CM | POA: Diagnosis not present

## 2020-09-15 DIAGNOSIS — Z86711 Personal history of pulmonary embolism: Secondary | ICD-10-CM | POA: Diagnosis not present

## 2020-09-15 DIAGNOSIS — R791 Abnormal coagulation profile: Secondary | ICD-10-CM | POA: Diagnosis not present

## 2020-09-30 DIAGNOSIS — Z86711 Personal history of pulmonary embolism: Secondary | ICD-10-CM | POA: Diagnosis not present

## 2020-09-30 DIAGNOSIS — R791 Abnormal coagulation profile: Secondary | ICD-10-CM | POA: Diagnosis not present

## 2020-10-06 DIAGNOSIS — I129 Hypertensive chronic kidney disease with stage 1 through stage 4 chronic kidney disease, or unspecified chronic kidney disease: Secondary | ICD-10-CM | POA: Diagnosis not present

## 2020-10-06 DIAGNOSIS — Z79899 Other long term (current) drug therapy: Secondary | ICD-10-CM | POA: Diagnosis not present

## 2020-10-06 DIAGNOSIS — E538 Deficiency of other specified B group vitamins: Secondary | ICD-10-CM | POA: Diagnosis not present

## 2020-10-06 DIAGNOSIS — Z86711 Personal history of pulmonary embolism: Secondary | ICD-10-CM | POA: Diagnosis not present

## 2020-10-06 DIAGNOSIS — F32A Depression, unspecified: Secondary | ICD-10-CM | POA: Diagnosis not present

## 2020-10-06 DIAGNOSIS — Z7901 Long term (current) use of anticoagulants: Secondary | ICD-10-CM | POA: Diagnosis not present

## 2020-10-06 DIAGNOSIS — N183 Chronic kidney disease, stage 3 unspecified: Secondary | ICD-10-CM | POA: Diagnosis not present

## 2020-10-06 DIAGNOSIS — E785 Hyperlipidemia, unspecified: Secondary | ICD-10-CM | POA: Diagnosis not present

## 2020-10-06 DIAGNOSIS — N3281 Overactive bladder: Secondary | ICD-10-CM | POA: Diagnosis not present

## 2020-10-06 DIAGNOSIS — E119 Type 2 diabetes mellitus without complications: Secondary | ICD-10-CM | POA: Diagnosis not present

## 2020-10-13 DIAGNOSIS — M1812 Unilateral primary osteoarthritis of first carpometacarpal joint, left hand: Secondary | ICD-10-CM | POA: Diagnosis not present

## 2020-10-25 DIAGNOSIS — Z7901 Long term (current) use of anticoagulants: Secondary | ICD-10-CM | POA: Diagnosis not present

## 2020-10-25 DIAGNOSIS — Z86711 Personal history of pulmonary embolism: Secondary | ICD-10-CM | POA: Diagnosis not present

## 2020-10-25 DIAGNOSIS — E538 Deficiency of other specified B group vitamins: Secondary | ICD-10-CM | POA: Diagnosis not present

## 2020-11-01 DIAGNOSIS — N3090 Cystitis, unspecified without hematuria: Secondary | ICD-10-CM | POA: Diagnosis not present

## 2020-11-01 DIAGNOSIS — J069 Acute upper respiratory infection, unspecified: Secondary | ICD-10-CM | POA: Diagnosis not present

## 2020-11-01 DIAGNOSIS — N3001 Acute cystitis with hematuria: Secondary | ICD-10-CM | POA: Diagnosis not present

## 2020-11-24 DIAGNOSIS — N390 Urinary tract infection, site not specified: Secondary | ICD-10-CM | POA: Diagnosis not present

## 2020-11-24 DIAGNOSIS — E538 Deficiency of other specified B group vitamins: Secondary | ICD-10-CM | POA: Diagnosis not present

## 2020-11-24 DIAGNOSIS — Z86711 Personal history of pulmonary embolism: Secondary | ICD-10-CM | POA: Diagnosis not present

## 2020-11-24 DIAGNOSIS — R300 Dysuria: Secondary | ICD-10-CM | POA: Diagnosis not present

## 2020-11-24 DIAGNOSIS — Z7901 Long term (current) use of anticoagulants: Secondary | ICD-10-CM | POA: Diagnosis not present

## 2020-12-08 DIAGNOSIS — Z87440 Personal history of urinary (tract) infections: Secondary | ICD-10-CM | POA: Diagnosis not present

## 2020-12-08 DIAGNOSIS — N390 Urinary tract infection, site not specified: Secondary | ICD-10-CM | POA: Diagnosis not present

## 2020-12-23 DIAGNOSIS — Z7901 Long term (current) use of anticoagulants: Secondary | ICD-10-CM | POA: Diagnosis not present

## 2020-12-23 DIAGNOSIS — R0602 Shortness of breath: Secondary | ICD-10-CM | POA: Diagnosis not present

## 2020-12-23 DIAGNOSIS — Z86711 Personal history of pulmonary embolism: Secondary | ICD-10-CM | POA: Diagnosis not present

## 2020-12-27 DIAGNOSIS — E538 Deficiency of other specified B group vitamins: Secondary | ICD-10-CM | POA: Diagnosis not present

## 2020-12-29 DIAGNOSIS — N3281 Overactive bladder: Secondary | ICD-10-CM | POA: Diagnosis not present

## 2020-12-29 DIAGNOSIS — Z79899 Other long term (current) drug therapy: Secondary | ICD-10-CM | POA: Diagnosis not present

## 2020-12-29 DIAGNOSIS — B3731 Acute candidiasis of vulva and vagina: Secondary | ICD-10-CM | POA: Diagnosis not present

## 2020-12-29 DIAGNOSIS — N390 Urinary tract infection, site not specified: Secondary | ICD-10-CM | POA: Diagnosis not present

## 2021-01-10 DIAGNOSIS — K219 Gastro-esophageal reflux disease without esophagitis: Secondary | ICD-10-CM | POA: Diagnosis not present

## 2021-01-10 DIAGNOSIS — N183 Chronic kidney disease, stage 3 unspecified: Secondary | ICD-10-CM | POA: Diagnosis not present

## 2021-01-10 DIAGNOSIS — E559 Vitamin D deficiency, unspecified: Secondary | ICD-10-CM | POA: Diagnosis not present

## 2021-01-10 DIAGNOSIS — E538 Deficiency of other specified B group vitamins: Secondary | ICD-10-CM | POA: Diagnosis not present

## 2021-01-10 DIAGNOSIS — F32A Depression, unspecified: Secondary | ICD-10-CM | POA: Diagnosis not present

## 2021-01-10 DIAGNOSIS — E119 Type 2 diabetes mellitus without complications: Secondary | ICD-10-CM | POA: Diagnosis not present

## 2021-01-10 DIAGNOSIS — I129 Hypertensive chronic kidney disease with stage 1 through stage 4 chronic kidney disease, or unspecified chronic kidney disease: Secondary | ICD-10-CM | POA: Diagnosis not present

## 2021-01-18 DIAGNOSIS — R791 Abnormal coagulation profile: Secondary | ICD-10-CM | POA: Diagnosis not present

## 2021-01-18 DIAGNOSIS — Z86711 Personal history of pulmonary embolism: Secondary | ICD-10-CM | POA: Diagnosis not present

## 2021-01-31 DIAGNOSIS — N390 Urinary tract infection, site not specified: Secondary | ICD-10-CM | POA: Diagnosis not present

## 2021-02-01 DIAGNOSIS — E538 Deficiency of other specified B group vitamins: Secondary | ICD-10-CM | POA: Diagnosis not present

## 2021-02-01 DIAGNOSIS — Z7901 Long term (current) use of anticoagulants: Secondary | ICD-10-CM | POA: Diagnosis not present

## 2021-02-01 DIAGNOSIS — D6861 Antiphospholipid syndrome: Secondary | ICD-10-CM | POA: Diagnosis not present

## 2021-02-15 DIAGNOSIS — Z86711 Personal history of pulmonary embolism: Secondary | ICD-10-CM | POA: Diagnosis not present

## 2021-02-15 DIAGNOSIS — Z7901 Long term (current) use of anticoagulants: Secondary | ICD-10-CM | POA: Diagnosis not present

## 2021-03-08 DIAGNOSIS — Z9849 Cataract extraction status, unspecified eye: Secondary | ICD-10-CM | POA: Diagnosis not present

## 2021-03-08 DIAGNOSIS — Z961 Presence of intraocular lens: Secondary | ICD-10-CM | POA: Diagnosis not present

## 2021-03-08 DIAGNOSIS — H35372 Puckering of macula, left eye: Secondary | ICD-10-CM | POA: Diagnosis not present

## 2021-03-08 DIAGNOSIS — H52223 Regular astigmatism, bilateral: Secondary | ICD-10-CM | POA: Diagnosis not present

## 2021-03-08 DIAGNOSIS — H524 Presbyopia: Secondary | ICD-10-CM | POA: Diagnosis not present

## 2021-03-16 DIAGNOSIS — Z7901 Long term (current) use of anticoagulants: Secondary | ICD-10-CM | POA: Diagnosis not present

## 2021-03-16 DIAGNOSIS — E538 Deficiency of other specified B group vitamins: Secondary | ICD-10-CM | POA: Diagnosis not present

## 2021-03-16 DIAGNOSIS — Z86711 Personal history of pulmonary embolism: Secondary | ICD-10-CM | POA: Diagnosis not present

## 2021-03-24 DIAGNOSIS — N390 Urinary tract infection, site not specified: Secondary | ICD-10-CM | POA: Diagnosis not present

## 2021-03-24 DIAGNOSIS — N3281 Overactive bladder: Secondary | ICD-10-CM | POA: Diagnosis not present

## 2021-03-24 DIAGNOSIS — B3731 Acute candidiasis of vulva and vagina: Secondary | ICD-10-CM | POA: Diagnosis not present

## 2021-03-25 DIAGNOSIS — Z9181 History of falling: Secondary | ICD-10-CM | POA: Diagnosis not present

## 2021-03-25 DIAGNOSIS — Z0000 Encounter for general adult medical examination without abnormal findings: Secondary | ICD-10-CM | POA: Diagnosis not present

## 2021-03-25 DIAGNOSIS — E785 Hyperlipidemia, unspecified: Secondary | ICD-10-CM | POA: Diagnosis not present

## 2021-03-25 DIAGNOSIS — Z139 Encounter for screening, unspecified: Secondary | ICD-10-CM | POA: Diagnosis not present

## 2021-04-11 DIAGNOSIS — I129 Hypertensive chronic kidney disease with stage 1 through stage 4 chronic kidney disease, or unspecified chronic kidney disease: Secondary | ICD-10-CM | POA: Diagnosis not present

## 2021-04-11 DIAGNOSIS — Z86711 Personal history of pulmonary embolism: Secondary | ICD-10-CM | POA: Diagnosis not present

## 2021-04-11 DIAGNOSIS — N183 Chronic kidney disease, stage 3 unspecified: Secondary | ICD-10-CM | POA: Diagnosis not present

## 2021-04-11 DIAGNOSIS — E785 Hyperlipidemia, unspecified: Secondary | ICD-10-CM | POA: Diagnosis not present

## 2021-04-11 DIAGNOSIS — E1122 Type 2 diabetes mellitus with diabetic chronic kidney disease: Secondary | ICD-10-CM | POA: Diagnosis not present

## 2021-04-11 DIAGNOSIS — Z7901 Long term (current) use of anticoagulants: Secondary | ICD-10-CM | POA: Diagnosis not present

## 2021-04-15 DIAGNOSIS — E538 Deficiency of other specified B group vitamins: Secondary | ICD-10-CM | POA: Diagnosis not present

## 2021-04-20 DIAGNOSIS — J208 Acute bronchitis due to other specified organisms: Secondary | ICD-10-CM | POA: Diagnosis not present

## 2021-04-26 DIAGNOSIS — N390 Urinary tract infection, site not specified: Secondary | ICD-10-CM | POA: Diagnosis not present

## 2021-04-26 DIAGNOSIS — N3281 Overactive bladder: Secondary | ICD-10-CM | POA: Diagnosis not present

## 2021-05-05 DIAGNOSIS — Z86711 Personal history of pulmonary embolism: Secondary | ICD-10-CM | POA: Diagnosis not present

## 2021-05-05 DIAGNOSIS — Z7901 Long term (current) use of anticoagulants: Secondary | ICD-10-CM | POA: Diagnosis not present

## 2021-05-17 DIAGNOSIS — E538 Deficiency of other specified B group vitamins: Secondary | ICD-10-CM | POA: Diagnosis not present

## 2021-05-24 DIAGNOSIS — N3281 Overactive bladder: Secondary | ICD-10-CM | POA: Diagnosis not present

## 2021-05-24 DIAGNOSIS — N390 Urinary tract infection, site not specified: Secondary | ICD-10-CM | POA: Diagnosis not present

## 2021-06-07 DIAGNOSIS — R600 Localized edema: Secondary | ICD-10-CM | POA: Diagnosis not present

## 2021-06-07 DIAGNOSIS — Z86711 Personal history of pulmonary embolism: Secondary | ICD-10-CM | POA: Diagnosis not present

## 2021-06-07 DIAGNOSIS — E559 Vitamin D deficiency, unspecified: Secondary | ICD-10-CM | POA: Diagnosis not present

## 2021-06-07 DIAGNOSIS — Z7901 Long term (current) use of anticoagulants: Secondary | ICD-10-CM | POA: Diagnosis not present

## 2021-06-09 DIAGNOSIS — I340 Nonrheumatic mitral (valve) insufficiency: Secondary | ICD-10-CM | POA: Diagnosis not present

## 2021-06-09 DIAGNOSIS — I081 Rheumatic disorders of both mitral and tricuspid valves: Secondary | ICD-10-CM | POA: Diagnosis not present

## 2021-06-09 DIAGNOSIS — R600 Localized edema: Secondary | ICD-10-CM | POA: Diagnosis not present

## 2021-06-24 DIAGNOSIS — E538 Deficiency of other specified B group vitamins: Secondary | ICD-10-CM | POA: Diagnosis not present

## 2021-07-06 DIAGNOSIS — Z86711 Personal history of pulmonary embolism: Secondary | ICD-10-CM | POA: Diagnosis not present

## 2021-07-06 DIAGNOSIS — R791 Abnormal coagulation profile: Secondary | ICD-10-CM | POA: Diagnosis not present

## 2021-07-13 DIAGNOSIS — Z7901 Long term (current) use of anticoagulants: Secondary | ICD-10-CM | POA: Diagnosis not present

## 2021-07-13 DIAGNOSIS — Z86711 Personal history of pulmonary embolism: Secondary | ICD-10-CM | POA: Diagnosis not present

## 2021-07-20 DIAGNOSIS — Z7901 Long term (current) use of anticoagulants: Secondary | ICD-10-CM | POA: Diagnosis not present

## 2021-07-20 DIAGNOSIS — Z86711 Personal history of pulmonary embolism: Secondary | ICD-10-CM | POA: Diagnosis not present

## 2021-07-21 ENCOUNTER — Ambulatory Visit: Payer: Medicare Other | Admitting: Cardiology

## 2021-07-22 ENCOUNTER — Encounter: Payer: Self-pay | Admitting: Cardiology

## 2021-07-22 ENCOUNTER — Ambulatory Visit (INDEPENDENT_AMBULATORY_CARE_PROVIDER_SITE_OTHER): Payer: Medicare Other

## 2021-07-22 ENCOUNTER — Ambulatory Visit: Payer: Medicare Other | Admitting: Cardiology

## 2021-07-22 VITALS — BP 126/78 | HR 78 | Ht 60.0 in | Wt 210.4 lb

## 2021-07-22 DIAGNOSIS — R002 Palpitations: Secondary | ICD-10-CM | POA: Diagnosis not present

## 2021-07-22 DIAGNOSIS — R0602 Shortness of breath: Secondary | ICD-10-CM

## 2021-07-22 NOTE — Progress Notes (Signed)
Cardiology Consultation:    Date:  07/22/2021   ID:  Stacie Rodriguez, DOB [birth year], MRN [id number]  PCP:  Earlyne Iba, NP  Cardiologist:  Jenne Campus, MD   Referring MD: Earlyne Iba, NP   Chief Complaint  Patient presents with   Fatigue    fall    History of Present Illness:    Stacie Rodriguez is a 76 y.o. female who is being seen today for the evaluation of abnormal echocardiogram at the request of Potts, Georgeann Oppenheim, NP.  Patient is a complicated lady with past medical history significant for antiphospholipid antibody syndrome, type 2 diabetes, depression, she is long-term anticoagulated, hypertension.  She was referred to me because of abnormal echocardiogram however when asked her why she is here she cannot tell me why she thinks the main reason why she is in my office the fact that she gets some irregularity of the heartbeat.  She describes some palpitations.  She describes some skipped beats from time to time.  Her ability to exercise very limited she sits in the chair majority of time she walks very slowly she walks very slowly to my office denies have any chest pain tightness squeezing pressure burning chest no shortness of breath.  No dizziness no passing out  Past Medical History:  Diagnosis Date   Adenomatous colon polyp    Anxiety    Blood transfusion without reported diagnosis 04/2008   GERD (gastroesophageal reflux disease)     Past Surgical History:  Procedure Laterality Date   ANAL FISSURE REPAIR  03/24/1999   APPENDECTOMY  01/23/1962   CATARACT EXTRACTION, BILATERAL     CHOLECYSTECTOMY  04/23/2008   menicus tear repaired     REPLACEMENT TOTAL KNEE Right    spleen removed     TONSILLECTOMY AND ADENOIDECTOMY  01/24/1964    Current Medications: Current Meds  Medication Sig   albuterol (VENTOLIN HFA) 108 (90 Base) MCG/ACT inhaler Inhale 2 puffs into the lungs every 6 (six) hours as needed for wheezing or shortness of breath.   atorvastatin (LIPITOR)  20 MG tablet Take 20 mg by mouth daily.   Cholecalciferol (VITAMIN D3) 50 MCG (2000 UT) CAPS Take 1 capsule by mouth daily.   furosemide (LASIX) 20 MG tablet Take 10 mg by mouth daily.   lisinopril (ZESTRIL) 5 MG tablet Take 5 mg by mouth daily.   nitrofurantoin (MACRODANTIN) 100 MG capsule Take 100 mg by mouth daily.   omeprazole (PRILOSEC) 20 MG capsule Take 40 mg by mouth every other day.   senna-docusate (SENOKOT-S) 8.6-50 MG tablet Take 2 tablets by mouth daily.   sertraline (ZOLOFT) 100 MG tablet Take 200 mg by mouth in the morning and at bedtime.   warfarin (COUMADIN) 3 MG tablet Take 3 mg by mouth daily. Daily on Sunday   warfarin (COUMADIN) 4 MG tablet Take 4 mg by mouth every other day. Every other day during week   [DISCONTINUED] MOVIPREP 100 G SOLR MOVI PREP take as directed no substitution (Patient taking differently: 1 kit once. MOVI PREP take as directed no substitution)     Allergies:   Codeine, Ibuprofen, Penicillins, and Sulfonamide derivatives   Social History   Socioeconomic History   Marital status: Married    Spouse name: Not on file   Number of children: Not on file   Years of education: Not on file   Highest education level: Not on file  Occupational History   Not on file  Tobacco Use  Smoking status: Never   Smokeless tobacco: Never  Substance and Sexual Activity   Alcohol use: No   Drug use: No   Sexual activity: Not on file  Other Topics Concern   Not on file  Social History Narrative   Not on file   Social Determinants of Health   Financial Resource Strain: Not on file  Food Insecurity: Not on file  Transportation Needs: Not on file  Physical Activity: Not on file  Stress: Not on file  Social Connections: Not on file     Family History: The patient's  family history includes Colon polyps in her paternal aunt. ROS:   Please see the history of present illness.    All 14 point review of systems negative except as described per history of  present illness.  EKGs/Labs/Other Studies Reviewed:    The following studies were reviewed today: Echocardiogram from the hospital showed ejection fraction 55 to 60%, impaired relaxation, moderately enlarged left atrium, mild mitral valve regurgitation trace tricuspid regurgitation.  EKG:  EKG is  ordered today.  The ekg ordered today demonstrates   Recent Labs: No results found for requested labs within last 365 days.  Recent Lipid Panel    Component Value Date/Time   CHOL 102 05/01/2008 0911   TRIG 120.0 05/01/2008 0911   HDL 21.50 (L) 05/01/2008 0911   CHOLHDL 5 05/01/2008 0911   VLDL 24.0 05/01/2008 0911   LDLCALC 57 05/01/2008 0911   LDLDIRECT 101.7 05/09/2007 1103    Physical Exam:    VS:  BP 126/78 (BP Location: Left Arm, Patient Position: Sitting)   Pulse 78   Ht 5' (1.524 m)   Wt 210 lb 6.4 oz (95.4 kg)   SpO2 93%   BMI 41.09 kg/m     Wt Readings from Last 3 Encounters:  07/22/21 210 lb 6.4 oz (95.4 kg)  11/03/11 216 lb (98 kg)     GEN:  Well nourished, well developed in no acute distress HEENT: Normal NECK: No JVD; No carotid bruits LYMPHATICS: No lymphadenopathy CARDIAC: RRR, no murmurs, no rubs, no gallops RESPIRATORY:  Clear to auscultation without rales, wheezing or rhonchi  ABDOMEN: Soft, non-tender, non-distended MUSCULOSKELETAL:  No edema; No deformity  SKIN: Warm and dry NEUROLOGIC:  Alert and oriented x 3 PSYCHIATRIC:  Normal affect   ASSESSMENT:    1. DYSPNEA   2. Palpitations    PLAN:    In order of problems listed above:  Palpitations.  I asked her to wear Zio patch for 2 weeks to see if she had any significant arrhythmia.  I will not initiate any therapy until have better diagnosis.  Echocardiogram reviewed with the patient showing only minor issues which does not require any attention right now Essential hypertension blood pressure well controlled continue present management Dyspnea on exertion multifactorial and probably immobility  play significant role here.  I encouraged her to be a little more active. I do not see any indication for CAD interrogation at this moment.  However we will continue discussion with her. We also had discussion about anticoagulation she is taking Coumadin we initiated conversation about more newer agent like Eliquis but she is scared of this because she is not sure what the level of anticoagulation is.  I spent critical time talking to her about this but she did not make any decision about changing it  Medication Adjustments/Labs and Tests Ordered: Current medicines are reviewed at length with the patient today.  Concerns regarding medicines are outlined  above.  Orders Placed This Encounter  Procedures   LONG TERM MONITOR (3-14 DAYS)   EKG 12-Lead   No orders of the defined types were placed in this encounter.   Signed, Park Liter, MD, [facility]. 07/22/2021 5:11 PM    [facility]

## 2021-07-22 NOTE — Patient Instructions (Addendum)
Medication Instructions:  Your physician recommends that you continue on your current medications as directed. Please refer to the Current Medication list given to you today.  *If you need a refill on your cardiac medications before your next appointment, please call your pharmacy*   Lab Work: None Ordered If you have labs (blood work) drawn today and your tests are completely normal, you will receive your results only by: MyChart Message (if you have MyChart) OR A paper copy in the mail If you have any lab test that is abnormal or we need to change your treatment, we will call you to review the results.   Testing/Procedures:  WHY IS MY DOCTOR PRESCRIBING ZIO? The Zio system is proven and trusted by physicians to detect and diagnose irregular heart rhythms -- and has been prescribed to hundreds of thousands of patients.  The FDA has cleared the Zio system to monitor for many different kinds of irregular heart rhythms. In a study, physicians were able to reach a diagnosis 90% of the time with the Zio system1.  You can wear the Zio monitor -- a small, discreet, comfortable patch -- during your normal day-to-day activity, including while you sleep, shower, and exercise, while it records every single heartbeat for analysis.  1Barrett, P., et al. Comparison of 24 Hour Holter Monitoring Versus 14 Day Novel Adhesive Patch Electrocardiographic Monitoring. American Journal of Medicine, 2014.  ZIO VS. HOLTER MONITORING The Zio monitor can be comfortably worn for up to 14 days. Holter monitors can be worn for 24 to 48 hours, limiting the time to record any irregular heart rhythms you may have. Zio is able to capture data for the 51% of patients who have their first symptom-triggered arrhythmia after 48 hours.1  LIVE WITHOUT RESTRICTIONS The Zio ambulatory cardiac monitor is a small, unobtrusive, and water-resistant patch--you might even forget you're wearing it. The Zio monitor records and stores  every beat of your heart, whether you're sleeping, working out, or showering.     Follow-Up: At [facility], you and your health needs are our priority.  As part of our continuing mission to provide you with exceptional heart care, we have created designated Provider Care Teams.  These Care Teams include your primary Cardiologist (physician) and Advanced Practice Providers (APPs -  Physician Assistants and Nurse Practitioners) who all work together to provide you with the care you need, when you need it.  We recommend signing up for the patient portal called "MyChart".  Sign up information is provided on this After Visit Summary.  MyChart is used to connect with patients for Virtual Visits (Telemedicine).  Patients are able to view lab/test results, encounter notes, upcoming appointments, etc.  Non-urgent messages can be sent to your provider as well.   To learn more about what you can do with MyChart, go to [email or web address].com.    Your next appointment:   2 month(s)  The format for your next appointment:   In Person  Provider:   Robert Krasowski, MD    Other Instructions NA  

## 2021-07-27 DIAGNOSIS — E538 Deficiency of other specified B group vitamins: Secondary | ICD-10-CM | POA: Diagnosis not present

## 2021-08-02 DIAGNOSIS — Z86711 Personal history of pulmonary embolism: Secondary | ICD-10-CM | POA: Diagnosis not present

## 2021-08-02 DIAGNOSIS — Z7901 Long term (current) use of anticoagulants: Secondary | ICD-10-CM | POA: Diagnosis not present

## 2021-08-02 DIAGNOSIS — Z862 Personal history of diseases of the blood and blood-forming organs and certain disorders involving the immune mechanism: Secondary | ICD-10-CM | POA: Diagnosis not present

## 2021-08-02 DIAGNOSIS — E1122 Type 2 diabetes mellitus with diabetic chronic kidney disease: Secondary | ICD-10-CM | POA: Diagnosis not present

## 2021-08-02 DIAGNOSIS — D6861 Antiphospholipid syndrome: Secondary | ICD-10-CM | POA: Diagnosis not present

## 2021-08-02 DIAGNOSIS — N183 Chronic kidney disease, stage 3 unspecified: Secondary | ICD-10-CM | POA: Diagnosis not present

## 2021-08-02 DIAGNOSIS — I129 Hypertensive chronic kidney disease with stage 1 through stage 4 chronic kidney disease, or unspecified chronic kidney disease: Secondary | ICD-10-CM | POA: Diagnosis not present

## 2021-08-02 DIAGNOSIS — E785 Hyperlipidemia, unspecified: Secondary | ICD-10-CM | POA: Diagnosis not present

## 2021-08-12 DIAGNOSIS — R0602 Shortness of breath: Secondary | ICD-10-CM | POA: Diagnosis not present

## 2021-08-16 ENCOUNTER — Ambulatory Visit: Payer: Medicare Other | Admitting: Cardiology

## 2021-08-25 ENCOUNTER — Telehealth: Payer: Self-pay

## 2021-08-25 NOTE — Telephone Encounter (Signed)
Unable to reach the patient or LM. I mailed a letter requesting a call back.

## 2021-08-25 NOTE — Telephone Encounter (Signed)
-----   Message from Park Liter, MD sent at 08/18/2021  9:48 AM EDT ----- Monitor shows some supraventricular tachycardia, not dangerous we will talk details during next visit

## 2021-09-01 DIAGNOSIS — E538 Deficiency of other specified B group vitamins: Secondary | ICD-10-CM | POA: Diagnosis not present

## 2021-09-01 DIAGNOSIS — R791 Abnormal coagulation profile: Secondary | ICD-10-CM | POA: Diagnosis not present

## 2021-09-01 DIAGNOSIS — Z86711 Personal history of pulmonary embolism: Secondary | ICD-10-CM | POA: Diagnosis not present

## 2021-09-02 NOTE — Telephone Encounter (Signed)
LVM for pt with results of Monitor- per DPR- Per Dr. Wendy Poet note. Encouraged to call with any questions or concerns.

## 2021-09-02 NOTE — Telephone Encounter (Signed)
Patient is returning call.  °

## 2021-09-07 DIAGNOSIS — N390 Urinary tract infection, site not specified: Secondary | ICD-10-CM | POA: Diagnosis not present

## 2021-09-07 DIAGNOSIS — N3281 Overactive bladder: Secondary | ICD-10-CM | POA: Diagnosis not present

## 2021-09-12 DIAGNOSIS — Z7901 Long term (current) use of anticoagulants: Secondary | ICD-10-CM | POA: Diagnosis not present

## 2021-09-12 DIAGNOSIS — J069 Acute upper respiratory infection, unspecified: Secondary | ICD-10-CM | POA: Diagnosis not present

## 2021-09-12 DIAGNOSIS — Z86711 Personal history of pulmonary embolism: Secondary | ICD-10-CM | POA: Diagnosis not present

## 2021-09-22 ENCOUNTER — Encounter: Payer: Self-pay | Admitting: Cardiology

## 2021-09-22 ENCOUNTER — Ambulatory Visit: Payer: Medicare Other | Attending: Cardiology | Admitting: Cardiology

## 2021-09-22 VITALS — BP 160/82 | HR 84 | Ht 60.0 in | Wt 204.0 lb

## 2021-09-22 DIAGNOSIS — I10 Essential (primary) hypertension: Secondary | ICD-10-CM | POA: Diagnosis not present

## 2021-09-22 DIAGNOSIS — Z86711 Personal history of pulmonary embolism: Secondary | ICD-10-CM

## 2021-09-22 DIAGNOSIS — R002 Palpitations: Secondary | ICD-10-CM

## 2021-09-22 MED ORDER — FUROSEMIDE 20 MG PO TABS: 20.0000 mg | ORAL_TABLET | Freq: Every day | ORAL | 3 refills | Status: AC

## 2021-09-22 NOTE — Progress Notes (Signed)
Cardiology Office Note:    Date:  09/22/2021   ID:  Stacie Rodriguez, DOB 1945/10/18, MRN [id number]  PCP:  Earlyne Iba, NP  Cardiologist:  Jenne Campus, MD    Referring MD: Earlyne Iba, NP   Chief Complaint  Patient presents with   Follow-up    History of Present Illness:    Stacie Rodriguez is a 76 y.o. female with past medical history significant for pulmonary emboli, she is on Coumadin, she was referred to [place] because of palpitations as well as dyspnea on exertion.  Additional past medical history include dyslipidemia as well as essential hypertension. She is she does the same.  She talks a lot and she looks very nervous today.  Interestingly last time her blood pressure was perfect and is very elevated, we will recheck her blood pressure again still elevated.  Denies having any palpitations this time I did review monitor with her which shows some supraventricular tachycardia.  She complained of having knee pain and leg pain that prevent him from walking as much as she needs to and wants to.  Denies have any chest pain tightness squeezing pressure burning chest  Past Medical History:  Diagnosis Date   Adenomatous colon polyp    Anxiety    Blood transfusion without reported diagnosis 04/2008   GERD (gastroesophageal reflux disease)     Past Surgical History:  Procedure Laterality Date   ANAL FISSURE REPAIR  03/24/1999   APPENDECTOMY  01/23/1962   CATARACT EXTRACTION, BILATERAL     CHOLECYSTECTOMY  04/23/2008   menicus tear repaired     REPLACEMENT TOTAL KNEE Right    spleen removed     TONSILLECTOMY AND ADENOIDECTOMY  01/24/1964    Current Medications: Current Meds  Medication Sig   albuterol (VENTOLIN HFA) 108 (90 Base) MCG/ACT inhaler Inhale 2 puffs into the lungs every 6 (six) hours as needed for wheezing or shortness of breath.   atorvastatin (LIPITOR) 20 MG tablet Take 20 mg by mouth daily.   Cholecalciferol (VITAMIN D3) 50 MCG (2000 UT) CAPS Take 1  capsule by mouth daily.   clonazePAM (KLONOPIN) 0.5 MG tablet Take 0.5 mg by mouth 2 (two) times daily.   furosemide (LASIX) 20 MG tablet Take 10 mg by mouth daily.   lisinopril (ZESTRIL) 5 MG tablet Take 5 mg by mouth daily.   nitrofurantoin (MACRODANTIN) 100 MG capsule Take 100 mg by mouth daily.   omeprazole (PRILOSEC) 20 MG capsule Take 40 mg by mouth every other day.   senna-docusate (SENOKOT-S) 8.6-50 MG tablet Take 2 tablets by mouth daily.   sertraline (ZOLOFT) 100 MG tablet Take 200 mg by mouth in the morning and at bedtime.   warfarin (COUMADIN) 3 MG tablet Take 3 mg by mouth daily. Daily on Sunday   warfarin (COUMADIN) 4 MG tablet Take 4 mg by mouth every other day. Every other day during week     Allergies:   Codeine, Ibuprofen, Penicillins, and Sulfonamide derivatives   Social History   Socioeconomic History   Marital status: Married    Spouse name: Not on file   Number of children: Not on file   Years of education: Not on file   Highest education level: Not on file  Occupational History   Not on file  Tobacco Use   Smoking status: Never   Smokeless tobacco: Never  Substance and Sexual Activity   Alcohol use: No   Drug use: No   Sexual activity: Not on file  Other Topics Concern   Not on file  Social History Narrative   Not on file   Social Determinants of Health   Financial Resource Strain: Not on file  Food Insecurity: Not on file  Transportation Needs: Not on file  Physical Activity: Not on file  Stress: Not on file  Social Connections: Not on file     Family History: The patient's family history includes Colon polyps in her paternal aunt. ROS:   Please see the history of present illness.    All 14 point review of systems negative except as described per history of present illness  EKGs/Labs/Other Studies Reviewed:      Recent Labs: No results found for requested labs within last 365 days.  Recent Lipid Panel    Component Value Date/Time    CHOL 102 05/01/2008 0911   TRIG 120.0 05/01/2008 0911   HDL 21.50 (L) 05/01/2008 0911   CHOLHDL 5 05/01/2008 0911   VLDL 24.0 05/01/2008 0911   LDLCALC 57 05/01/2008 0911   LDLDIRECT 101.7 05/09/2007 1103    Physical Exam:    VS:  BP (!) 180/80 (BP Location: Left Arm, Patient Position: Sitting, Cuff Size: Normal)   Pulse 84   Ht 5' (1.524 m)   Wt 204 lb (92.5 kg)   SpO2 91%   BMI 39.84 kg/m     Wt Readings from Last 3 Encounters:  09/22/21 204 lb (92.5 kg)  07/22/21 210 lb 6.4 oz (95.4 kg)  11/03/11 216 lb (98 kg)     GEN:  Well nourished, well developed in no acute distress HEENT: Normal NECK: No JVD; No carotid bruits LYMPHATICS: No lymphadenopathy CARDIAC: RRR, no murmurs, no rubs, no gallops RESPIRATORY:  Clear to auscultation without rales, wheezing or rhonchi  ABDOMEN: Soft, non-tender, non-distended MUSCULOSKELETAL:  No edema; No deformity  SKIN: Warm and dry LOWER EXTREMITIES: no swelling NEUROLOGIC:  Alert and oriented x 3 PSYCHIATRIC:  Normal affect   ASSESSMENT:    1. Essential hypertension   2. Palpitations   3. History of pulmonary embolism    PLAN:    In order of problems listed above:  Essential hypertension management is somewhat difficult she does not want to take anymore medications.  I was able to convince her to go back on her Lasix 20 mg daily next week we will check her Chem-7 she did have some reservation because of some kidney dysfunction.  The purpose of diuretic will be to help with the blood pressure as well as help with the diastolic dysfunction that we noted on echocardiogram which may be contributing to her dyspnea exertion.  I suspect however her dyspnea is multifactorial, sedentary lifestyle lack of activity contributes significantly to it. Patient's I did review her monitor which showed supraventricular tachycardia, short episodes.  In the future we will try to negotiate to get some beta-blocker on board but today she is not open for  any suggestion about starting new medications she was taking furosemide before this when she agreed to go back on it History of pulmonary emboli still on Coumadin my conversation previously about switching to Eliquis did not lead to any concrete decisions she still prefers Coumadin Dyslipidemia I did review K PN which show me her LDL of 66 HDL 53.  She is on Lipitor 20 which I will continue   Medication Adjustments/Labs and Tests Ordered: Current medicines are reviewed at length with the patient today.  Concerns regarding medicines are outlined above.  No orders of the defined  types were placed in this encounter.  Medication changes: No orders of the defined types were placed in this encounter.   Signed, Park Liter, MD, [facility] 09/22/2021 1:45 PM    [place]

## 2021-09-22 NOTE — Patient Instructions (Signed)
Medication Instructions:  Your physician has recommended you make the following change in your medication:   START: Lasix '20mg'$  1 tablet daily by mouth    Lab Work: BMP- 1 week Your physician recommends that you return for lab work in: 1 week You need to have labs done when you are fasting.  You can come Monday through Friday 8:30 am to 12:00 pm and 1:15 to 4:30. You do not need to make an appointment as the order has already been placed. The labs you are going to have done are BMET   Testing/Procedures: None Ordered   Follow-Up: At [facility], you and your health needs are our priority.  As part of our continuing mission to provide you with exceptional heart care, we have created designated Provider Care Teams.  These Care Teams include your primary Cardiologist (physician) and Advanced Practice Providers (APPs -  Physician Assistants and Nurse Practitioners) who all work together to provide you with the care you need, when you need it.  We recommend signing up for the patient portal called "MyChart".  Sign up information is provided on this After Visit Summary.  MyChart is used to connect with patients for Virtual Visits (Telemedicine).  Patients are able to view lab/test results, encounter notes, upcoming appointments, etc.  Non-urgent messages can be sent to your provider as well.   To learn more about what you can do with MyChart, go to [email or web address].    Your next appointment:   3 month(s)  The format for your next appointment:   In Person  Provider:   Jenne Campus, MD    Other Instructions NA

## 2021-10-04 DIAGNOSIS — L820 Inflamed seborrheic keratosis: Secondary | ICD-10-CM | POA: Diagnosis not present

## 2021-10-04 DIAGNOSIS — L578 Other skin changes due to chronic exposure to nonionizing radiation: Secondary | ICD-10-CM | POA: Diagnosis not present

## 2021-10-04 DIAGNOSIS — L821 Other seborrheic keratosis: Secondary | ICD-10-CM | POA: Diagnosis not present

## 2021-10-04 DIAGNOSIS — L280 Lichen simplex chronicus: Secondary | ICD-10-CM | POA: Diagnosis not present

## 2021-10-07 DIAGNOSIS — J0100 Acute maxillary sinusitis, unspecified: Secondary | ICD-10-CM | POA: Diagnosis not present

## 2021-10-07 DIAGNOSIS — Z7901 Long term (current) use of anticoagulants: Secondary | ICD-10-CM | POA: Diagnosis not present

## 2021-10-07 DIAGNOSIS — D6861 Antiphospholipid syndrome: Secondary | ICD-10-CM | POA: Diagnosis not present

## 2021-10-13 DIAGNOSIS — I10 Essential (primary) hypertension: Secondary | ICD-10-CM | POA: Diagnosis not present

## 2021-10-14 DIAGNOSIS — D6861 Antiphospholipid syndrome: Secondary | ICD-10-CM | POA: Diagnosis not present

## 2021-10-14 DIAGNOSIS — Z7901 Long term (current) use of anticoagulants: Secondary | ICD-10-CM | POA: Diagnosis not present

## 2021-10-14 LAB — BASIC METABOLIC PANEL
BUN/Creatinine Ratio: 31 — ABNORMAL HIGH (ref 12–28)
BUN: 34 mg/dL — ABNORMAL HIGH (ref 8–27)
CO2: 24 mmol/L (ref 20–29)
Calcium: 9.4 mg/dL (ref 8.7–10.3)
Chloride: 99 mmol/L (ref 96–106)
Creatinine, Ser: 1.11 mg/dL — ABNORMAL HIGH (ref 0.57–1.00)
Glucose: 185 mg/dL — ABNORMAL HIGH (ref 70–99)
Potassium: 4.1 mmol/L (ref 3.5–5.2)
Sodium: 140 mmol/L (ref 134–144)
eGFR: 52 mL/min/{1.73_m2} — ABNORMAL LOW (ref >59)

## 2021-10-20 ENCOUNTER — Telehealth: Payer: Self-pay

## 2021-10-20 DIAGNOSIS — I10 Essential (primary) hypertension: Secondary | ICD-10-CM

## 2021-10-20 NOTE — Telephone Encounter (Signed)
Patient notified of results and aware of lab work request. Order on file

## 2021-10-20 NOTE — Telephone Encounter (Signed)
-----   Message from Park Liter, MD sent at 10/18/2021  1:25 PM EDT ----- Kidney function mildly elevated but still acceptable, just to be sure lets repeat Chem-7 in about a week

## 2021-10-28 DIAGNOSIS — D6861 Antiphospholipid syndrome: Secondary | ICD-10-CM | POA: Diagnosis not present

## 2021-10-28 DIAGNOSIS — E538 Deficiency of other specified B group vitamins: Secondary | ICD-10-CM | POA: Diagnosis not present

## 2021-10-28 DIAGNOSIS — R791 Abnormal coagulation profile: Secondary | ICD-10-CM | POA: Diagnosis not present

## 2021-11-02 DIAGNOSIS — K219 Gastro-esophageal reflux disease without esophagitis: Secondary | ICD-10-CM | POA: Diagnosis not present

## 2021-11-02 DIAGNOSIS — I129 Hypertensive chronic kidney disease with stage 1 through stage 4 chronic kidney disease, or unspecified chronic kidney disease: Secondary | ICD-10-CM | POA: Diagnosis not present

## 2021-11-02 DIAGNOSIS — N182 Chronic kidney disease, stage 2 (mild): Secondary | ICD-10-CM | POA: Diagnosis not present

## 2021-11-02 DIAGNOSIS — Z23 Encounter for immunization: Secondary | ICD-10-CM | POA: Diagnosis not present

## 2021-11-02 DIAGNOSIS — D6861 Antiphospholipid syndrome: Secondary | ICD-10-CM | POA: Diagnosis not present

## 2021-11-02 DIAGNOSIS — R791 Abnormal coagulation profile: Secondary | ICD-10-CM | POA: Diagnosis not present

## 2021-11-02 DIAGNOSIS — E1122 Type 2 diabetes mellitus with diabetic chronic kidney disease: Secondary | ICD-10-CM | POA: Diagnosis not present

## 2021-11-02 DIAGNOSIS — E782 Mixed hyperlipidemia: Secondary | ICD-10-CM | POA: Diagnosis not present

## 2021-11-15 DIAGNOSIS — N390 Urinary tract infection, site not specified: Secondary | ICD-10-CM | POA: Diagnosis not present

## 2021-12-22 ENCOUNTER — Ambulatory Visit: Payer: Medicare Other | Attending: Cardiology | Admitting: Cardiology

## 2021-12-22 ENCOUNTER — Encounter: Payer: Self-pay | Admitting: Cardiology

## 2021-12-22 ENCOUNTER — Telehealth: Payer: Self-pay | Admitting: Cardiology

## 2021-12-22 VITALS — BP 166/94 | HR 92 | Temp 98.8°F | Ht 60.0 in | Wt 208.2 lb

## 2021-12-22 DIAGNOSIS — R531 Weakness: Secondary | ICD-10-CM | POA: Diagnosis not present

## 2021-12-22 DIAGNOSIS — I10 Essential (primary) hypertension: Secondary | ICD-10-CM | POA: Diagnosis not present

## 2021-12-22 DIAGNOSIS — R112 Nausea with vomiting, unspecified: Secondary | ICD-10-CM | POA: Diagnosis not present

## 2021-12-22 DIAGNOSIS — I700 Atherosclerosis of aorta: Secondary | ICD-10-CM | POA: Diagnosis not present

## 2021-12-22 DIAGNOSIS — I119 Hypertensive heart disease without heart failure: Secondary | ICD-10-CM | POA: Diagnosis not present

## 2021-12-22 DIAGNOSIS — Z86711 Personal history of pulmonary embolism: Secondary | ICD-10-CM | POA: Diagnosis not present

## 2021-12-22 DIAGNOSIS — Z7901 Long term (current) use of anticoagulants: Secondary | ICD-10-CM | POA: Diagnosis not present

## 2021-12-22 DIAGNOSIS — Z79899 Other long term (current) drug therapy: Secondary | ICD-10-CM | POA: Diagnosis not present

## 2021-12-22 DIAGNOSIS — J9811 Atelectasis: Secondary | ICD-10-CM | POA: Diagnosis not present

## 2021-12-22 DIAGNOSIS — K5730 Diverticulosis of large intestine without perforation or abscess without bleeding: Secondary | ICD-10-CM | POA: Diagnosis not present

## 2021-12-22 DIAGNOSIS — R0902 Hypoxemia: Secondary | ICD-10-CM | POA: Diagnosis not present

## 2021-12-22 DIAGNOSIS — Z20822 Contact with and (suspected) exposure to covid-19: Secondary | ICD-10-CM | POA: Diagnosis not present

## 2021-12-22 NOTE — Telephone Encounter (Signed)
Stacie Rodriguez that pt is supposed to be in the ED for evaluation.

## 2021-12-22 NOTE — Patient Instructions (Signed)
Medication Instructions:  Your physician recommends that you continue on your current medications as directed. Please refer to the Current Medication list given to you today.  *If you need a refill on your cardiac medications before your next appointment, please call your pharmacy*   Lab Work: None If you have labs (blood work) drawn today and your tests are completely normal, you will receive your results only by: [place] (if you have MyChart) OR A paper copy in the mail If you have any lab test that is abnormal or we need to change your treatment, we will call you to review the results.   Testing/Procedures: None   Follow-Up: At [facility], you and your health needs are our priority.  As part of our continuing mission to provide you with exceptional heart care, we have created designated Provider Care Teams.  These Care Teams include your primary Cardiologist (physician) and Advanced Practice Providers (APPs -  Physician Assistants and Nurse Practitioners) who all work together to provide you with the care you need, when you need it.  We recommend signing up for the patient portal called "MyChart".  Sign up information is provided on this After Visit Summary.  MyChart is used to connect with patients for Virtual Visits (Telemedicine).  Patients are able to view lab/test results, encounter notes, upcoming appointments, etc.  Non-urgent messages can be sent to your provider as well.   To learn more about what you can do with MyChart, go to [email or web address].    Your next appointment:   3 month(s)  The format for your next appointment:   In Person  Provider:   Jenne Campus, MD    Other Instructions None  Important Information About [place]

## 2021-12-22 NOTE — Telephone Encounter (Signed)
Stacie Rodriguez is calling from [facility] stating the patient is there stating Dr. Agustin Cree advised her to come for an xray. Guy Franco I do not see an order in the system, but a message would be sent to the nurse for confirmation. Please advise.

## 2021-12-22 NOTE — Progress Notes (Signed)
Cardiology Office Note:    Date:  12/22/2021   ID:  IO DIEUJUSTE, DOB [birth year], MRN [id number]  PCP:  Darrol Jump, PA-C  Cardiologist:  Jenne Campus, MD    Referring MD: Earlyne Iba, NP   Chief Complaint  Patient presents with   Follow-up    Patient has been experiencing weak, nausea and vomiting, nasal drainage since yesterday. Patient had a UTI and has not been able to treat this because she can't tolerate most ABX.     History of Present Illness:    Stacie Rodriguez is a 76 y.o. female  with past medical history significant for pulmonary emboli, she is on Coumadin, she was referred to [place] because of palpitations as well as dyspnea on exertion. Additional past medical history include dyslipidemia as well as essential hypertension.  She is coming today for regular follow-up as she does not feel good.  She said she was sick since yesterday she got multiple episode of vomiting.  She looks tired exhausted and barely answering my questions.  Denies have any chest pain tightness squeezing pressure burning chest did not check her fever.  She does have stuffy nose.  Past Medical History:  Diagnosis Date   Adenomatous colon polyp    Anxiety    Blood transfusion without reported diagnosis 04/2008   GERD (gastroesophageal reflux disease)     Past Surgical History:  Procedure Laterality Date   ANAL FISSURE REPAIR  03/24/1999   APPENDECTOMY  01/23/1962   CATARACT EXTRACTION, BILATERAL     CHOLECYSTECTOMY  04/23/2008   menicus tear repaired     REPLACEMENT TOTAL KNEE Right    spleen removed     TONSILLECTOMY AND ADENOIDECTOMY  01/24/1964    Current Medications: Current Meds  Medication Sig   albuterol (VENTOLIN HFA) 108 (90 Base) MCG/ACT inhaler Inhale 2 puffs into the lungs every 6 (six) hours as needed for wheezing or shortness of breath.   apixaban (ELIQUIS) 5 MG TABS tablet Take 5 mg by mouth 2 (two) times daily.   atorvastatin (LIPITOR) 20 MG tablet Take 20 mg  by mouth daily.   Cholecalciferol (VITAMIN D3) 50 MCG (2000 UT) CAPS Take 1 capsule by mouth daily.   clonazePAM (KLONOPIN) 0.5 MG tablet Take 0.5 mg by mouth 2 (two) times daily.   furosemide (LASIX) 20 MG tablet Take 1 tablet (20 mg total) by mouth daily. (Patient taking differently: Take 20 mg by mouth See admin instructions. Takes 1 whole tablet every other day and 1/2 tab the remaining days)   lisinopril (ZESTRIL) 5 MG tablet Take 5 mg by mouth daily.   omeprazole (PRILOSEC) 20 MG capsule Take 40 mg by mouth every other day.   senna-docusate (SENOKOT-S) 8.6-50 MG tablet Take 2 tablets by mouth daily.   sertraline (ZOLOFT) 100 MG tablet Take 200 mg by mouth in the morning and at bedtime.   [DISCONTINUED] nitrofurantoin (MACRODANTIN) 100 MG capsule Take 100 mg by mouth daily.   [DISCONTINUED] warfarin (COUMADIN) 3 MG tablet Take 3 mg by mouth daily. Daily on Sunday   [DISCONTINUED] warfarin (COUMADIN) 4 MG tablet Take 4 mg by mouth every other day. Every other day during week     Allergies:   Codeine, Ibuprofen, Penicillins, and Sulfonamide derivatives   Social History   Socioeconomic History   Marital status: Married    Spouse name: Not on file   Number of children: Not on file   Years of education: Not on file   Highest  education level: Not on file  Occupational History   Not on file  Tobacco Use   Smoking status: Never   Smokeless tobacco: Never  Substance and Sexual Activity   Alcohol use: No   Drug use: No   Sexual activity: Not on file  Other Topics Concern   Not on file  Social History Narrative   Not on file   Social Determinants of Health   Financial Resource Strain: Not on file  Food Insecurity: Not on file  Transportation Needs: Not on file  Physical Activity: Not on file  Stress: Not on file  Social Connections: Not on file     Family History: The patient's family history includes Colon polyps in her paternal aunt. ROS:   Please see the history of  present illness.    All 14 point review of systems negative except as described per history of present illness  EKGs/Labs/Other Studies Reviewed:      Recent Labs: 10/13/2021: BUN 34; Creatinine, Ser 1.11; Potassium 4.1; Sodium 140  Recent Lipid Panel    Component Value Date/Time   CHOL 102 05/01/2008 0911   TRIG 120.0 05/01/2008 0911   HDL 21.50 (L) 05/01/2008 0911   CHOLHDL 5 05/01/2008 0911   VLDL 24.0 05/01/2008 0911   LDLCALC 57 05/01/2008 0911   LDLDIRECT 101.7 05/09/2007 1103    Physical Exam:    VS:  BP (!) 166/94 (BP Location: Left Arm, Patient Position: Sitting)   Pulse 92   Temp 98.8 F (37.1 C)   Ht 5' (1.524 m)   Wt 208 lb 3.2 oz (94.4 kg)   SpO2 97%   BMI 40.66 kg/m     Wt Readings from Last 3 Encounters:  12/22/21 208 lb 3.2 oz (94.4 kg)  09/22/21 204 lb (92.5 kg)  07/22/21 210 lb 6.4 oz (95.4 kg)     GEN:  Well nourished, well developed in no acute distress HEENT: Normal NECK: No JVD; No carotid bruits LYMPHATICS: No lymphadenopathy CARDIAC: RRR, no murmurs, no rubs, no gallops RESPIRATORY: Bilateral crackles both bases ABDOMEN: Soft, non-tender, non-distended MUSCULOSKELETAL:  No edema; No deformity  SKIN: Warm and dry LOWER EXTREMITIES: no swelling NEUROLOGIC:  Alert and oriented x 3 PSYCHIATRIC:  Normal affect   ASSESSMENT:    1. Essential hypertension   2. History of pulmonary embolism    PLAN:    In order of problems listed above:  Essential hypertension still uncontrolled she was reluctant previously to take taking medications, today she is sick and she will be referred to the emergency room for quick evaluation I am more about her potentially having COVID as well. History of pulmonary emboli.  She is on Coumadin.  However she agreed to switch to Eliquis and now she is been taking Eliquis 5 mg twice daily. Supraventricular tachycardia short episode of asymptomatic continue present management hopefully she will accept finally my offer  to take small dose of beta-blocker so far I was unsuccessful trying to examine squeezing her to have it Dyslipidemia she is taking Lipitor 20 which I will continue   Medication Adjustments/Labs and Tests Ordered: Current medicines are reviewed at length with the patient today.  Concerns regarding medicines are outlined above.  No orders of the defined types were placed in this encounter.  Medication changes: No orders of the defined types were placed in this encounter.   Signed, Park Liter, MD, [facility] 12/22/2021 11:06 AM    [place]

## 2021-12-23 DIAGNOSIS — R112 Nausea with vomiting, unspecified: Secondary | ICD-10-CM | POA: Diagnosis not present

## 2021-12-23 DIAGNOSIS — I498 Other specified cardiac arrhythmias: Secondary | ICD-10-CM | POA: Diagnosis not present

## 2021-12-23 DIAGNOSIS — I509 Heart failure, unspecified: Secondary | ICD-10-CM | POA: Diagnosis not present

## 2021-12-23 DIAGNOSIS — R0902 Hypoxemia: Secondary | ICD-10-CM | POA: Diagnosis not present

## 2021-12-23 DIAGNOSIS — R059 Cough, unspecified: Secondary | ICD-10-CM | POA: Diagnosis not present

## 2021-12-23 DIAGNOSIS — R531 Weakness: Secondary | ICD-10-CM | POA: Diagnosis not present

## 2021-12-23 DIAGNOSIS — I214 Non-ST elevation (NSTEMI) myocardial infarction: Secondary | ICD-10-CM | POA: Diagnosis not present

## 2021-12-24 DIAGNOSIS — I2699 Other pulmonary embolism without acute cor pulmonale: Secondary | ICD-10-CM | POA: Diagnosis not present

## 2021-12-24 DIAGNOSIS — R112 Nausea with vomiting, unspecified: Secondary | ICD-10-CM | POA: Diagnosis not present

## 2021-12-24 DIAGNOSIS — M1990 Unspecified osteoarthritis, unspecified site: Secondary | ICD-10-CM | POA: Diagnosis not present

## 2021-12-24 DIAGNOSIS — Z1152 Encounter for screening for COVID-19: Secondary | ICD-10-CM | POA: Diagnosis not present

## 2021-12-24 DIAGNOSIS — Z86711 Personal history of pulmonary embolism: Secondary | ICD-10-CM | POA: Diagnosis not present

## 2021-12-24 DIAGNOSIS — I2510 Atherosclerotic heart disease of native coronary artery without angina pectoris: Secondary | ICD-10-CM | POA: Diagnosis not present

## 2021-12-24 DIAGNOSIS — Z885 Allergy status to narcotic agent status: Secondary | ICD-10-CM | POA: Diagnosis not present

## 2021-12-24 DIAGNOSIS — I214 Non-ST elevation (NSTEMI) myocardial infarction: Secondary | ICD-10-CM | POA: Diagnosis not present

## 2021-12-24 DIAGNOSIS — R059 Cough, unspecified: Secondary | ICD-10-CM | POA: Diagnosis not present

## 2021-12-24 DIAGNOSIS — Z79899 Other long term (current) drug therapy: Secondary | ICD-10-CM | POA: Diagnosis not present

## 2021-12-24 DIAGNOSIS — K219 Gastro-esophageal reflux disease without esophagitis: Secondary | ICD-10-CM | POA: Diagnosis not present

## 2021-12-24 DIAGNOSIS — D649 Anemia, unspecified: Secondary | ICD-10-CM | POA: Diagnosis not present

## 2021-12-24 DIAGNOSIS — E785 Hyperlipidemia, unspecified: Secondary | ICD-10-CM | POA: Diagnosis not present

## 2021-12-24 DIAGNOSIS — D591 Autoimmune hemolytic anemia, unspecified: Secondary | ICD-10-CM | POA: Diagnosis not present

## 2021-12-24 DIAGNOSIS — R918 Other nonspecific abnormal finding of lung field: Secondary | ICD-10-CM | POA: Diagnosis not present

## 2021-12-24 DIAGNOSIS — N179 Acute kidney failure, unspecified: Secondary | ICD-10-CM | POA: Diagnosis not present

## 2021-12-24 DIAGNOSIS — K5730 Diverticulosis of large intestine without perforation or abscess without bleeding: Secondary | ICD-10-CM | POA: Diagnosis not present

## 2021-12-24 DIAGNOSIS — J90 Pleural effusion, not elsewhere classified: Secondary | ICD-10-CM | POA: Diagnosis not present

## 2021-12-24 DIAGNOSIS — Z882 Allergy status to sulfonamides status: Secondary | ICD-10-CM | POA: Diagnosis not present

## 2021-12-24 DIAGNOSIS — I498 Other specified cardiac arrhythmias: Secondary | ICD-10-CM | POA: Diagnosis not present

## 2021-12-24 DIAGNOSIS — J9601 Acute respiratory failure with hypoxia: Secondary | ICD-10-CM | POA: Diagnosis not present

## 2021-12-24 DIAGNOSIS — I110 Hypertensive heart disease with heart failure: Secondary | ICD-10-CM | POA: Diagnosis not present

## 2021-12-24 DIAGNOSIS — I5033 Acute on chronic diastolic (congestive) heart failure: Secondary | ICD-10-CM | POA: Diagnosis not present

## 2021-12-24 DIAGNOSIS — R7401 Elevation of levels of liver transaminase levels: Secondary | ICD-10-CM | POA: Diagnosis not present

## 2021-12-24 DIAGNOSIS — I700 Atherosclerosis of aorta: Secondary | ICD-10-CM | POA: Diagnosis not present

## 2021-12-24 DIAGNOSIS — Z7901 Long term (current) use of anticoagulants: Secondary | ICD-10-CM | POA: Diagnosis not present

## 2021-12-24 DIAGNOSIS — I361 Nonrheumatic tricuspid (valve) insufficiency: Secondary | ICD-10-CM | POA: Diagnosis not present

## 2021-12-24 DIAGNOSIS — E119 Type 2 diabetes mellitus without complications: Secondary | ICD-10-CM | POA: Diagnosis not present

## 2021-12-24 DIAGNOSIS — Z87440 Personal history of urinary (tract) infections: Secondary | ICD-10-CM | POA: Diagnosis not present

## 2021-12-24 DIAGNOSIS — Z886 Allergy status to analgesic agent status: Secondary | ICD-10-CM | POA: Diagnosis not present

## 2021-12-24 DIAGNOSIS — R5381 Other malaise: Secondary | ICD-10-CM | POA: Diagnosis not present

## 2021-12-24 DIAGNOSIS — F32A Depression, unspecified: Secondary | ICD-10-CM | POA: Diagnosis not present

## 2021-12-24 DIAGNOSIS — I509 Heart failure, unspecified: Secondary | ICD-10-CM | POA: Diagnosis not present

## 2021-12-24 DIAGNOSIS — I10 Essential (primary) hypertension: Secondary | ICD-10-CM | POA: Diagnosis not present

## 2021-12-24 DIAGNOSIS — R531 Weakness: Secondary | ICD-10-CM | POA: Diagnosis not present

## 2021-12-24 DIAGNOSIS — R0902 Hypoxemia: Secondary | ICD-10-CM | POA: Diagnosis not present

## 2021-12-24 DIAGNOSIS — R0602 Shortness of breath: Secondary | ICD-10-CM | POA: Diagnosis not present

## 2021-12-24 DIAGNOSIS — Z880 Allergy status to penicillin: Secondary | ICD-10-CM | POA: Diagnosis not present

## 2021-12-25 DIAGNOSIS — I361 Nonrheumatic tricuspid (valve) insufficiency: Secondary | ICD-10-CM | POA: Diagnosis not present

## 2021-12-26 DIAGNOSIS — I214 Non-ST elevation (NSTEMI) myocardial infarction: Secondary | ICD-10-CM | POA: Diagnosis not present

## 2021-12-26 DIAGNOSIS — I2699 Other pulmonary embolism without acute cor pulmonale: Secondary | ICD-10-CM | POA: Diagnosis not present

## 2021-12-26 DIAGNOSIS — D649 Anemia, unspecified: Secondary | ICD-10-CM | POA: Diagnosis not present

## 2021-12-26 DIAGNOSIS — I10 Essential (primary) hypertension: Secondary | ICD-10-CM | POA: Diagnosis not present

## 2021-12-28 DIAGNOSIS — I252 Old myocardial infarction: Secondary | ICD-10-CM | POA: Diagnosis not present

## 2021-12-28 DIAGNOSIS — Z79899 Other long term (current) drug therapy: Secondary | ICD-10-CM | POA: Diagnosis not present

## 2021-12-29 DIAGNOSIS — Z792 Long term (current) use of antibiotics: Secondary | ICD-10-CM | POA: Diagnosis not present

## 2021-12-29 DIAGNOSIS — D591 Autoimmune hemolytic anemia, unspecified: Secondary | ICD-10-CM | POA: Diagnosis not present

## 2021-12-29 DIAGNOSIS — I5033 Acute on chronic diastolic (congestive) heart failure: Secondary | ICD-10-CM | POA: Diagnosis not present

## 2021-12-29 DIAGNOSIS — I214 Non-ST elevation (NSTEMI) myocardial infarction: Secondary | ICD-10-CM | POA: Diagnosis not present

## 2021-12-29 DIAGNOSIS — D6861 Antiphospholipid syndrome: Secondary | ICD-10-CM | POA: Diagnosis not present

## 2021-12-29 DIAGNOSIS — I2721 Secondary pulmonary arterial hypertension: Secondary | ICD-10-CM | POA: Diagnosis not present

## 2021-12-29 DIAGNOSIS — J9601 Acute respiratory failure with hypoxia: Secondary | ICD-10-CM | POA: Diagnosis not present

## 2021-12-29 DIAGNOSIS — Z9081 Acquired absence of spleen: Secondary | ICD-10-CM | POA: Diagnosis not present

## 2021-12-29 DIAGNOSIS — D6869 Other thrombophilia: Secondary | ICD-10-CM | POA: Diagnosis not present

## 2021-12-29 DIAGNOSIS — J45909 Unspecified asthma, uncomplicated: Secondary | ICD-10-CM | POA: Diagnosis not present

## 2021-12-29 DIAGNOSIS — Z7982 Long term (current) use of aspirin: Secondary | ICD-10-CM | POA: Diagnosis not present

## 2021-12-29 DIAGNOSIS — K219 Gastro-esophageal reflux disease without esophagitis: Secondary | ICD-10-CM | POA: Diagnosis not present

## 2021-12-29 DIAGNOSIS — I700 Atherosclerosis of aorta: Secondary | ICD-10-CM | POA: Diagnosis not present

## 2021-12-29 DIAGNOSIS — Z9981 Dependence on supplemental oxygen: Secondary | ICD-10-CM | POA: Diagnosis not present

## 2021-12-29 DIAGNOSIS — Z7951 Long term (current) use of inhaled steroids: Secondary | ICD-10-CM | POA: Diagnosis not present

## 2021-12-29 DIAGNOSIS — N179 Acute kidney failure, unspecified: Secondary | ICD-10-CM | POA: Diagnosis not present

## 2021-12-29 DIAGNOSIS — E119 Type 2 diabetes mellitus without complications: Secondary | ICD-10-CM | POA: Diagnosis not present

## 2021-12-29 DIAGNOSIS — E785 Hyperlipidemia, unspecified: Secondary | ICD-10-CM | POA: Diagnosis not present

## 2021-12-29 DIAGNOSIS — I110 Hypertensive heart disease with heart failure: Secondary | ICD-10-CM | POA: Diagnosis not present

## 2021-12-29 DIAGNOSIS — I2510 Atherosclerotic heart disease of native coronary artery without angina pectoris: Secondary | ICD-10-CM | POA: Diagnosis not present

## 2021-12-29 DIAGNOSIS — M1990 Unspecified osteoarthritis, unspecified site: Secondary | ICD-10-CM | POA: Diagnosis not present

## 2021-12-29 DIAGNOSIS — Z7901 Long term (current) use of anticoagulants: Secondary | ICD-10-CM | POA: Diagnosis not present

## 2021-12-30 DIAGNOSIS — I5033 Acute on chronic diastolic (congestive) heart failure: Secondary | ICD-10-CM | POA: Diagnosis not present

## 2021-12-30 DIAGNOSIS — J45909 Unspecified asthma, uncomplicated: Secondary | ICD-10-CM | POA: Diagnosis not present

## 2021-12-30 DIAGNOSIS — E119 Type 2 diabetes mellitus without complications: Secondary | ICD-10-CM | POA: Diagnosis not present

## 2021-12-30 DIAGNOSIS — Z743 Need for continuous supervision: Secondary | ICD-10-CM | POA: Diagnosis not present

## 2021-12-30 DIAGNOSIS — Z7901 Long term (current) use of anticoagulants: Secondary | ICD-10-CM | POA: Diagnosis not present

## 2021-12-30 DIAGNOSIS — N179 Acute kidney failure, unspecified: Secondary | ICD-10-CM | POA: Diagnosis not present

## 2021-12-30 DIAGNOSIS — I110 Hypertensive heart disease with heart failure: Secondary | ICD-10-CM | POA: Diagnosis not present

## 2021-12-30 DIAGNOSIS — D6869 Other thrombophilia: Secondary | ICD-10-CM | POA: Diagnosis not present

## 2021-12-30 DIAGNOSIS — I700 Atherosclerosis of aorta: Secondary | ICD-10-CM | POA: Diagnosis not present

## 2021-12-30 DIAGNOSIS — Z7982 Long term (current) use of aspirin: Secondary | ICD-10-CM | POA: Diagnosis not present

## 2021-12-30 DIAGNOSIS — Z792 Long term (current) use of antibiotics: Secondary | ICD-10-CM | POA: Diagnosis not present

## 2021-12-30 DIAGNOSIS — J9601 Acute respiratory failure with hypoxia: Secondary | ICD-10-CM | POA: Diagnosis not present

## 2021-12-30 DIAGNOSIS — I2721 Secondary pulmonary arterial hypertension: Secondary | ICD-10-CM | POA: Diagnosis not present

## 2021-12-30 DIAGNOSIS — Z9981 Dependence on supplemental oxygen: Secondary | ICD-10-CM | POA: Diagnosis not present

## 2021-12-30 DIAGNOSIS — R6889 Other general symptoms and signs: Secondary | ICD-10-CM | POA: Diagnosis not present

## 2021-12-30 DIAGNOSIS — I214 Non-ST elevation (NSTEMI) myocardial infarction: Secondary | ICD-10-CM | POA: Diagnosis not present

## 2021-12-30 DIAGNOSIS — E785 Hyperlipidemia, unspecified: Secondary | ICD-10-CM | POA: Diagnosis not present

## 2021-12-30 DIAGNOSIS — I2510 Atherosclerotic heart disease of native coronary artery without angina pectoris: Secondary | ICD-10-CM | POA: Diagnosis not present

## 2021-12-30 DIAGNOSIS — Z9081 Acquired absence of spleen: Secondary | ICD-10-CM | POA: Diagnosis not present

## 2021-12-30 DIAGNOSIS — D591 Autoimmune hemolytic anemia, unspecified: Secondary | ICD-10-CM | POA: Diagnosis not present

## 2021-12-30 DIAGNOSIS — M1990 Unspecified osteoarthritis, unspecified site: Secondary | ICD-10-CM | POA: Diagnosis not present

## 2021-12-30 DIAGNOSIS — Z7951 Long term (current) use of inhaled steroids: Secondary | ICD-10-CM | POA: Diagnosis not present

## 2021-12-30 DIAGNOSIS — R531 Weakness: Secondary | ICD-10-CM | POA: Diagnosis not present

## 2021-12-30 DIAGNOSIS — R0902 Hypoxemia: Secondary | ICD-10-CM | POA: Diagnosis not present

## 2021-12-30 DIAGNOSIS — K219 Gastro-esophageal reflux disease without esophagitis: Secondary | ICD-10-CM | POA: Diagnosis not present

## 2021-12-30 DIAGNOSIS — D6861 Antiphospholipid syndrome: Secondary | ICD-10-CM | POA: Diagnosis not present

## 2021-12-31 DIAGNOSIS — Z743 Need for continuous supervision: Secondary | ICD-10-CM | POA: Diagnosis not present

## 2021-12-31 DIAGNOSIS — J90 Pleural effusion, not elsewhere classified: Secondary | ICD-10-CM | POA: Diagnosis not present

## 2021-12-31 DIAGNOSIS — Z7901 Long term (current) use of anticoagulants: Secondary | ICD-10-CM | POA: Diagnosis not present

## 2021-12-31 DIAGNOSIS — R9431 Abnormal electrocardiogram [ECG] [EKG]: Secondary | ICD-10-CM | POA: Diagnosis not present

## 2021-12-31 DIAGNOSIS — Z20822 Contact with and (suspected) exposure to covid-19: Secondary | ICD-10-CM | POA: Diagnosis not present

## 2021-12-31 DIAGNOSIS — R531 Weakness: Secondary | ICD-10-CM | POA: Diagnosis not present

## 2021-12-31 DIAGNOSIS — R279 Unspecified lack of coordination: Secondary | ICD-10-CM | POA: Diagnosis not present

## 2021-12-31 DIAGNOSIS — I2510 Atherosclerotic heart disease of native coronary artery without angina pectoris: Secondary | ICD-10-CM | POA: Diagnosis not present

## 2021-12-31 DIAGNOSIS — I214 Non-ST elevation (NSTEMI) myocardial infarction: Secondary | ICD-10-CM | POA: Diagnosis not present

## 2021-12-31 DIAGNOSIS — R0602 Shortness of breath: Secondary | ICD-10-CM | POA: Diagnosis not present

## 2021-12-31 DIAGNOSIS — I3139 Other pericardial effusion (noninflammatory): Secondary | ICD-10-CM | POA: Diagnosis not present

## 2021-12-31 DIAGNOSIS — R0600 Dyspnea, unspecified: Secondary | ICD-10-CM | POA: Diagnosis not present

## 2021-12-31 DIAGNOSIS — J9811 Atelectasis: Secondary | ICD-10-CM | POA: Diagnosis not present

## 2021-12-31 DIAGNOSIS — I10 Essential (primary) hypertension: Secondary | ICD-10-CM | POA: Diagnosis not present

## 2022-01-02 ENCOUNTER — Encounter: Payer: Self-pay | Admitting: Cardiology

## 2022-01-02 ENCOUNTER — Telehealth: Payer: Self-pay

## 2022-01-02 ENCOUNTER — Ambulatory Visit: Payer: Medicare Other | Attending: Cardiology | Admitting: Cardiology

## 2022-01-02 VITALS — BP 151/69 | HR 86 | Ht 60.0 in | Wt 208.0 lb

## 2022-01-02 DIAGNOSIS — I110 Hypertensive heart disease with heart failure: Secondary | ICD-10-CM | POA: Diagnosis not present

## 2022-01-02 DIAGNOSIS — J45909 Unspecified asthma, uncomplicated: Secondary | ICD-10-CM | POA: Diagnosis not present

## 2022-01-02 DIAGNOSIS — D591 Autoimmune hemolytic anemia, unspecified: Secondary | ICD-10-CM | POA: Diagnosis not present

## 2022-01-02 DIAGNOSIS — I700 Atherosclerosis of aorta: Secondary | ICD-10-CM | POA: Diagnosis not present

## 2022-01-02 DIAGNOSIS — Z9981 Dependence on supplemental oxygen: Secondary | ICD-10-CM | POA: Diagnosis not present

## 2022-01-02 DIAGNOSIS — Z7982 Long term (current) use of aspirin: Secondary | ICD-10-CM | POA: Diagnosis not present

## 2022-01-02 DIAGNOSIS — I2510 Atherosclerotic heart disease of native coronary artery without angina pectoris: Secondary | ICD-10-CM | POA: Diagnosis not present

## 2022-01-02 DIAGNOSIS — D6869 Other thrombophilia: Secondary | ICD-10-CM | POA: Diagnosis not present

## 2022-01-02 DIAGNOSIS — J9601 Acute respiratory failure with hypoxia: Secondary | ICD-10-CM | POA: Diagnosis not present

## 2022-01-02 DIAGNOSIS — K219 Gastro-esophageal reflux disease without esophagitis: Secondary | ICD-10-CM | POA: Diagnosis not present

## 2022-01-02 DIAGNOSIS — I3139 Other pericardial effusion (noninflammatory): Secondary | ICD-10-CM | POA: Diagnosis not present

## 2022-01-02 DIAGNOSIS — Z9081 Acquired absence of spleen: Secondary | ICD-10-CM | POA: Diagnosis not present

## 2022-01-02 DIAGNOSIS — Z7951 Long term (current) use of inhaled steroids: Secondary | ICD-10-CM | POA: Diagnosis not present

## 2022-01-02 DIAGNOSIS — D6861 Antiphospholipid syndrome: Secondary | ICD-10-CM | POA: Diagnosis not present

## 2022-01-02 DIAGNOSIS — E119 Type 2 diabetes mellitus without complications: Secondary | ICD-10-CM | POA: Diagnosis not present

## 2022-01-02 DIAGNOSIS — E785 Hyperlipidemia, unspecified: Secondary | ICD-10-CM | POA: Diagnosis not present

## 2022-01-02 DIAGNOSIS — R0609 Other forms of dyspnea: Secondary | ICD-10-CM | POA: Diagnosis not present

## 2022-01-02 DIAGNOSIS — Z792 Long term (current) use of antibiotics: Secondary | ICD-10-CM | POA: Diagnosis not present

## 2022-01-02 DIAGNOSIS — I10 Essential (primary) hypertension: Secondary | ICD-10-CM | POA: Diagnosis not present

## 2022-01-02 DIAGNOSIS — Z7901 Long term (current) use of anticoagulants: Secondary | ICD-10-CM | POA: Diagnosis not present

## 2022-01-02 DIAGNOSIS — I5032 Chronic diastolic (congestive) heart failure: Secondary | ICD-10-CM | POA: Diagnosis not present

## 2022-01-02 DIAGNOSIS — N179 Acute kidney failure, unspecified: Secondary | ICD-10-CM | POA: Diagnosis not present

## 2022-01-02 DIAGNOSIS — I5033 Acute on chronic diastolic (congestive) heart failure: Secondary | ICD-10-CM | POA: Diagnosis not present

## 2022-01-02 DIAGNOSIS — I214 Non-ST elevation (NSTEMI) myocardial infarction: Secondary | ICD-10-CM | POA: Diagnosis not present

## 2022-01-02 DIAGNOSIS — I2721 Secondary pulmonary arterial hypertension: Secondary | ICD-10-CM | POA: Diagnosis not present

## 2022-01-02 DIAGNOSIS — M1990 Unspecified osteoarthritis, unspecified site: Secondary | ICD-10-CM | POA: Diagnosis not present

## 2022-01-02 DIAGNOSIS — I5030 Unspecified diastolic (congestive) heart failure: Secondary | ICD-10-CM | POA: Insufficient documentation

## 2022-01-02 NOTE — Telephone Encounter (Signed)
Pre cert per JB- for ECHO at [facility] [id number] exp. 02/16/2022. Called to [facility] scheduling and Left message

## 2022-01-02 NOTE — Progress Notes (Unsigned)
Cardiology Office Note:    Date:  01/02/2022   ID:  Stacie Rodriguez, DOB 1945/06/23, MRN [id number]  PCP:  Stacie Jump, PA-C  Cardiologist:  Stacie Campus, MD    Referring MD: Stacie Iba, NP   Chief Complaint  Patient presents with   Hospitalization Follow-up    Myocardial infarction and pneumonia C/o fatigue and weakness    History of Present Illness:    Stacie Rodriguez is a 76 y.o. female with past medical history significant for pulmonary emboli, she is being anticoag with Coumadin but recently switched to Eliquis, dyslipidemia, essential hypertension, diastolic congestive heart failure.  Last time I seen her she was doing very poorly and she was actually referred to the emergency room for evaluation.  That evaluation showed small non-STEMI with troponin barely reaching at 2, echocardiogram at that time showed normal left ventricle ejection fraction.  She was also treated for pneumonia, as well as congestive heart failure which is diastolic in nature, she will be discharged home and follow-up however since that time she end up going twice to the emergency room last CT done just few days ago showed possibility of pericardial fusion she was sent back to [place]. She is not doing well she is complaining about a lot of things she is tired exhausted looks somewhat pale.  Past Medical History:  Diagnosis Date   Adenomatous colon polyp    Anxiety    Blood transfusion without reported diagnosis 04/2008   GERD (gastroesophageal reflux disease)     Past Surgical History:  Procedure Laterality Date   ANAL FISSURE REPAIR  03/24/1999   APPENDECTOMY  01/23/1962   CATARACT EXTRACTION, BILATERAL     CHOLECYSTECTOMY  04/23/2008   menicus tear repaired     REPLACEMENT TOTAL KNEE Right    spleen removed     TONSILLECTOMY AND ADENOIDECTOMY  01/24/1964    Current Medications: Current Meds  Medication Sig   albuterol (VENTOLIN HFA) 108 (90 Base) MCG/ACT inhaler Inhale 2 puffs into  the lungs every 6 (six) hours as needed for wheezing or shortness of breath.   apixaban (ELIQUIS) 5 MG TABS tablet Take 5 mg by mouth 2 (two) times daily.   atorvastatin (LIPITOR) 20 MG tablet Take 20 mg by mouth daily.   Cholecalciferol (VITAMIN D3) 50 MCG (2000 UT) CAPS Take 1 capsule by mouth daily.   clonazePAM (KLONOPIN) 0.5 MG tablet Take 0.5 mg by mouth 2 (two) times daily.   furosemide (LASIX) 20 MG tablet Take 1 tablet (20 mg total) by mouth daily. (Patient taking differently: Take 20 mg by mouth See admin instructions. Takes 1 whole tablet every other day and 1/2 tab the remaining days)   levofloxacin (LEVAQUIN) 500 MG tablet Take 500 mg by mouth daily.   lisinopril (ZESTRIL) 5 MG tablet Take 5 mg by mouth daily.   metoprolol tartrate (LOPRESSOR) 25 MG tablet Take 12.5 mg by mouth 2 (two) times daily.   omeprazole (PRILOSEC) 20 MG capsule Take 40 mg by mouth every other day.   potassium chloride (KLOR-CON M) 10 MEQ tablet Take 10 mEq by mouth daily.   senna-docusate (SENOKOT-S) 8.6-50 MG tablet Take 2 tablets by mouth daily.   sertraline (ZOLOFT) 100 MG tablet Take 200 mg by mouth in the morning and at bedtime.     Allergies:   Codeine, Ibuprofen, Penicillins, and Sulfonamide derivatives   Social History   Socioeconomic History   Marital status: Married    Spouse name: Not on file  Number of children: Not on file   Years of education: Not on file   Highest education level: Not on file  Occupational History   Not on file  Tobacco Use   Smoking status: Never   Smokeless tobacco: Never  Substance and Sexual Activity   Alcohol use: No   Drug use: No   Sexual activity: Not Currently  Other Topics Concern   Not on file  Social History Narrative   Not on file   Social Determinants of Health   Financial Resource Strain: Not on file  Food Insecurity: Not on file  Transportation Needs: Not on file  Physical Activity: Not on file  Stress: Not on file  Social Connections:  Not on file     Family History: The patient's family history includes Colon polyps in her paternal aunt. ROS:   Please see the history of present illness.    All 14 point review of systems negative except as described per history of present illness  EKGs/Labs/Other Studies Reviewed:      Recent Labs: 10/13/2021: BUN 34; Creatinine, Ser 1.11; Potassium 4.1; Sodium 140  Recent Lipid Panel    Component Value Date/Time   CHOL 102 05/01/2008 0911   TRIG 120.0 05/01/2008 0911   HDL 21.50 (L) 05/01/2008 0911   CHOLHDL 5 05/01/2008 0911   VLDL 24.0 05/01/2008 0911   LDLCALC 57 05/01/2008 0911   LDLDIRECT 101.7 05/09/2007 1103    Physical Exam:    VS:  BP (!) 151/69 (BP Location: Right Arm, Patient Position: Sitting)   Pulse 86   Ht 5' (1.524 m)   Wt 208 lb (94.3 kg)   SpO2 93%   BMI 40.62 kg/m     Wt Readings from Last 3 Encounters:  01/02/22 208 lb (94.3 kg)  12/22/21 208 lb 3.2 oz (94.4 kg)  09/22/21 204 lb (92.5 kg)     GEN:  Well nourished, well developed in no acute distress HEENT: Normal NECK: No JVD; No carotid bruits LYMPHATICS: No lymphadenopathy CARDIAC: RRR, no murmurs, no rubs, no gallops RESPIRATORY:  Clear to auscultation without rales, wheezing or rhonchi  ABDOMEN: Soft, non-tender, non-distended MUSCULOSKELETAL:  No edema; No deformity  SKIN: Warm and dry LOWER EXTREMITIES: no swelling NEUROLOGIC:  Alert and oriented x 3 PSYCHIATRIC:  Normal affect   ASSESSMENT:    1. Essential hypertension   2. Pericardial effusion mild noted on CT few days ago   3. Coronary artery disease involving native coronary artery of native heart without angina pectoris   4. Chronic diastolic congestive heart failure (HCC)    PLAN:    In order of problems listed above:  Essential hypertension blood pressure still not well-controlled but she cannot tell me what medication she is taking.  Will try to go to pharmacy find out. Prior to have effusion noted on the CT.  I  will schedule to have echocardiogram to recheck size of the pericardial fusion. Diastolic congestive heart failure, I will check Chem-7 as well as proBNP today. Coronary disease status post non-STEMI but she is not initiate to have any testing done right now. I suspect significant portion of her symptomatology could be psychological.  She seems to be somewhat depressed to me.  However of course we will rule out any organic issues   Medication Adjustments/Labs and Tests Ordered: Current medicines are reviewed at length with the patient today.  Concerns regarding medicines are outlined above.  No orders of the defined types were placed in this encounter.  Medication changes: No orders of the defined types were placed in this encounter.   Signed, Park Liter, MD, [facility] 01/02/2022 10:56 AM    [place]

## 2022-01-02 NOTE — Patient Instructions (Signed)
Medication Instructions:  Your physician recommends that you continue on your current medications as directed. Please refer to the Current Medication list given to you today.  *If you need a refill on your cardiac medications before your next appointment, please call your pharmacy*   Lab Work: PoBNP, CMP, CBC- today If you have labs (blood work) drawn today and your tests are completely normal, you will receive your results only by: MyChart Message (if you have MyChart) OR A paper copy in the mail If you have any lab test that is abnormal or we need to change your treatment, we will call you to review the results.   Testing/Procedures: Your physician has requested that you have an echocardiogram. Echocardiography is a painless test that uses sound waves to create images of your heart. It provides your doctor with information about the size and shape of your heart and how well your heart's chambers and valves are working. This procedure takes approximately one hour. There are no restrictions for this procedure. Please do NOT wear cologne, perfume, aftershave, or lotions (deodorant is allowed). Please arrive 15 minutes prior to your appointment time.    Follow-Up: At [facility], you and your health needs are our priority.  As part of our continuing mission to provide you with exceptional heart care, we have created designated Provider Care Teams.  These Care Teams include your primary Cardiologist (physician) and Advanced Practice Providers (APPs -  Physician Assistants and Nurse Practitioners) who all work together to provide you with the care you need, when you need it.  We recommend signing up for the patient portal called "MyChart".  Sign up information is provided on this After Visit Summary.  MyChart is used to connect with patients for Virtual Visits (Telemedicine).  Patients are able to view lab/test results, encounter notes, upcoming appointments, etc.  Non-urgent messages can be  sent to your provider as well.   To learn more about what you can do with MyChart, go to [email or web address].    Your next appointment:   1 month(s)  The format for your next appointment:   In Person  Provider:   Jenne Campus, MD    Other Instructions NA

## 2022-01-03 DIAGNOSIS — I361 Nonrheumatic tricuspid (valve) insufficiency: Secondary | ICD-10-CM

## 2022-01-03 DIAGNOSIS — I5032 Chronic diastolic (congestive) heart failure: Secondary | ICD-10-CM | POA: Diagnosis not present

## 2022-01-03 DIAGNOSIS — I10 Essential (primary) hypertension: Secondary | ICD-10-CM | POA: Diagnosis not present

## 2022-01-03 DIAGNOSIS — R0609 Other forms of dyspnea: Secondary | ICD-10-CM | POA: Diagnosis not present

## 2022-01-03 DIAGNOSIS — I3139 Other pericardial effusion (noninflammatory): Secondary | ICD-10-CM | POA: Diagnosis not present

## 2022-01-03 DIAGNOSIS — I340 Nonrheumatic mitral (valve) insufficiency: Secondary | ICD-10-CM | POA: Diagnosis not present

## 2022-01-04 DIAGNOSIS — E785 Hyperlipidemia, unspecified: Secondary | ICD-10-CM | POA: Diagnosis not present

## 2022-01-04 DIAGNOSIS — D591 Autoimmune hemolytic anemia, unspecified: Secondary | ICD-10-CM | POA: Diagnosis not present

## 2022-01-04 DIAGNOSIS — I700 Atherosclerosis of aorta: Secondary | ICD-10-CM | POA: Diagnosis not present

## 2022-01-04 DIAGNOSIS — E119 Type 2 diabetes mellitus without complications: Secondary | ICD-10-CM | POA: Diagnosis not present

## 2022-01-04 DIAGNOSIS — D6869 Other thrombophilia: Secondary | ICD-10-CM | POA: Diagnosis not present

## 2022-01-04 DIAGNOSIS — Z9981 Dependence on supplemental oxygen: Secondary | ICD-10-CM | POA: Diagnosis not present

## 2022-01-04 DIAGNOSIS — J45909 Unspecified asthma, uncomplicated: Secondary | ICD-10-CM | POA: Diagnosis not present

## 2022-01-04 DIAGNOSIS — Z7951 Long term (current) use of inhaled steroids: Secondary | ICD-10-CM | POA: Diagnosis not present

## 2022-01-04 DIAGNOSIS — M1990 Unspecified osteoarthritis, unspecified site: Secondary | ICD-10-CM | POA: Diagnosis not present

## 2022-01-04 DIAGNOSIS — I214 Non-ST elevation (NSTEMI) myocardial infarction: Secondary | ICD-10-CM | POA: Diagnosis not present

## 2022-01-04 DIAGNOSIS — Z9081 Acquired absence of spleen: Secondary | ICD-10-CM | POA: Diagnosis not present

## 2022-01-04 DIAGNOSIS — I5033 Acute on chronic diastolic (congestive) heart failure: Secondary | ICD-10-CM | POA: Diagnosis not present

## 2022-01-04 DIAGNOSIS — Z7982 Long term (current) use of aspirin: Secondary | ICD-10-CM | POA: Diagnosis not present

## 2022-01-04 DIAGNOSIS — D6861 Antiphospholipid syndrome: Secondary | ICD-10-CM | POA: Diagnosis not present

## 2022-01-04 DIAGNOSIS — I110 Hypertensive heart disease with heart failure: Secondary | ICD-10-CM | POA: Diagnosis not present

## 2022-01-04 DIAGNOSIS — I2721 Secondary pulmonary arterial hypertension: Secondary | ICD-10-CM | POA: Diagnosis not present

## 2022-01-04 DIAGNOSIS — Z792 Long term (current) use of antibiotics: Secondary | ICD-10-CM | POA: Diagnosis not present

## 2022-01-04 DIAGNOSIS — N179 Acute kidney failure, unspecified: Secondary | ICD-10-CM | POA: Diagnosis not present

## 2022-01-04 DIAGNOSIS — Z7901 Long term (current) use of anticoagulants: Secondary | ICD-10-CM | POA: Diagnosis not present

## 2022-01-04 DIAGNOSIS — K219 Gastro-esophageal reflux disease without esophagitis: Secondary | ICD-10-CM | POA: Diagnosis not present

## 2022-01-04 DIAGNOSIS — I2510 Atherosclerotic heart disease of native coronary artery without angina pectoris: Secondary | ICD-10-CM | POA: Diagnosis not present

## 2022-01-04 DIAGNOSIS — J9601 Acute respiratory failure with hypoxia: Secondary | ICD-10-CM | POA: Diagnosis not present

## 2022-01-04 LAB — COMPREHENSIVE METABOLIC PANEL
ALT: 30 IU/L (ref 0–32)
AST: 21 IU/L (ref 0–40)
Albumin/Globulin Ratio: 1 — ABNORMAL LOW (ref 1.2–2.2)
Albumin: 3.2 g/dL — ABNORMAL LOW (ref 3.8–4.8)
Alkaline Phosphatase: 76 IU/L (ref 44–121)
BUN/Creatinine Ratio: 33 — ABNORMAL HIGH (ref 12–28)
BUN: 51 mg/dL — ABNORMAL HIGH (ref 8–27)
Bilirubin Total: 1.1 mg/dL (ref 0.0–1.2)
CO2: 27 mmol/L (ref 20–29)
Calcium: 9.3 mg/dL (ref 8.7–10.3)
Chloride: 96 mmol/L (ref 96–106)
Creatinine, Ser: 1.53 mg/dL — ABNORMAL HIGH (ref 0.57–1.00)
Globulin, Total: 3.1 g/dL (ref 1.5–4.5)
Glucose: 179 mg/dL — ABNORMAL HIGH (ref 70–99)
Potassium: 4.1 mmol/L (ref 3.5–5.2)
Sodium: 141 mmol/L (ref 134–144)
Total Protein: 6.3 g/dL (ref 6.0–8.5)
eGFR: 35 mL/min/{1.73_m2} — ABNORMAL LOW (ref >59)

## 2022-01-04 LAB — CBC
Hematocrit: 27.9 % — ABNORMAL LOW (ref 34.0–46.6)
Hemoglobin: 9.4 g/dL — ABNORMAL LOW (ref 11.1–15.9)
MCH: 32.8 pg (ref 26.6–33.0)
MCHC: 33.7 g/dL (ref 31.5–35.7)
MCV: 97 fL (ref 79–97)
Platelets: 277 10*3/uL (ref 150–450)
RBC: 2.87 x10E6/uL — ABNORMAL LOW (ref 3.77–5.28)
RDW: 13.8 % (ref 11.7–15.4)
WBC: 18.9 10*3/uL — ABNORMAL HIGH (ref 3.4–10.8)

## 2022-01-04 LAB — PRO B NATRIURETIC PEPTIDE: NT-Pro BNP: 6564 pg/mL — ABNORMAL HIGH (ref 0–738)

## 2022-01-05 DIAGNOSIS — Z7401 Bed confinement status: Secondary | ICD-10-CM | POA: Diagnosis not present

## 2022-01-05 DIAGNOSIS — R531 Weakness: Secondary | ICD-10-CM | POA: Diagnosis not present

## 2022-01-05 DIAGNOSIS — R0902 Hypoxemia: Secondary | ICD-10-CM | POA: Diagnosis not present

## 2022-01-05 DIAGNOSIS — R109 Unspecified abdominal pain: Secondary | ICD-10-CM | POA: Diagnosis not present

## 2022-01-05 DIAGNOSIS — Z743 Need for continuous supervision: Secondary | ICD-10-CM | POA: Diagnosis not present

## 2022-01-23 DEATH — deceased

## 2022-02-06 ENCOUNTER — Ambulatory Visit: Payer: Medicare Other | Admitting: Cardiology

## 2022-02-14 ENCOUNTER — Ambulatory Visit: Payer: Medicare Other | Admitting: Cardiology

## 2022-04-13 ENCOUNTER — Ambulatory Visit: Payer: Medicare Other | Admitting: Cardiology
